# Patient Record
Sex: Male | Born: 1985 | Race: White | Hispanic: No | Marital: Single | Smoking: Current every day smoker
Health system: Southern US, Community
[De-identification: ages and names within clinical notes are randomized; demographics above are authoritative.]

## PROBLEM LIST (undated history)

## (undated) DIAGNOSIS — M79605 Pain in left leg: Secondary | ICD-10-CM

## (undated) HISTORY — PX: FRACTURE SURGERY: SHX138

## (undated) HISTORY — PX: APPENDECTOMY: SHX54

## (undated) HISTORY — DX: Pain in left leg: M79.605

---

## 2009-12-17 HISTORY — PX: OTHER SURGICAL HISTORY: SHX169

## 2009-12-24 ENCOUNTER — Inpatient Hospital Stay: Payer: Self-pay | Admitting: General Practice

## 2011-04-21 ENCOUNTER — Emergency Department: Payer: Self-pay | Admitting: Emergency Medicine

## 2015-06-23 ENCOUNTER — Encounter: Payer: Self-pay | Admitting: Emergency Medicine

## 2015-06-23 ENCOUNTER — Emergency Department: Payer: Self-pay

## 2015-06-23 ENCOUNTER — Emergency Department
Admission: EM | Admit: 2015-06-23 | Discharge: 2015-06-23 | Disposition: A | Discharge status: Home / Self Care | Payer: Self-pay | Attending: Emergency Medicine | Admitting: Emergency Medicine

## 2015-06-23 DIAGNOSIS — Y939 Activity, unspecified: Secondary | ICD-10-CM | POA: Insufficient documentation

## 2015-06-23 DIAGNOSIS — F17200 Nicotine dependence, unspecified, uncomplicated: Secondary | ICD-10-CM | POA: Insufficient documentation

## 2015-06-23 DIAGNOSIS — Y929 Unspecified place or not applicable: Secondary | ICD-10-CM | POA: Insufficient documentation

## 2015-06-23 DIAGNOSIS — S93401A Sprain of unspecified ligament of right ankle, initial encounter: Secondary | ICD-10-CM | POA: Insufficient documentation

## 2015-06-23 DIAGNOSIS — Y999 Unspecified external cause status: Secondary | ICD-10-CM | POA: Insufficient documentation

## 2015-06-23 DIAGNOSIS — W19XXXA Unspecified fall, initial encounter: Secondary | ICD-10-CM | POA: Insufficient documentation

## 2015-06-23 MED ORDER — IBUPROFEN 800 MG PO TABS: 800.0000 mg | ORAL_TABLET | Freq: Three times a day (TID) | ORAL | Status: DC | PRN

## 2015-06-23 MED ORDER — TRAMADOL HCL 50 MG PO TABS: 50.0000 mg | ORAL_TABLET | Freq: Four times a day (QID) | ORAL | Status: DC | PRN

## 2015-06-23 MED ORDER — IBUPROFEN 800 MG PO TABS
800.0000 mg | ORAL_TABLET | Freq: Once | ORAL | Status: AC
  Administered 2015-06-23: 800 mg via ORAL
  Filled 2015-06-23: qty 1

## 2015-06-23 NOTE — Discharge Instructions (Signed)
Ankle Sprain °An ankle sprain is an injury to the strong, fibrous tissues (ligaments) that hold the bones of your ankle joint together.  °CAUSES °An ankle sprain is usually caused by a fall or by twisting your ankle. Ankle sprains most commonly occur when you step on the outer edge of your foot, and your ankle turns inward. People who participate in sports are more prone to these types of injuries.  °SYMPTOMS  °· Pain in your ankle. The pain may be present at rest or only when you are trying to stand or walk. °· Swelling. °· Bruising. Bruising may develop immediately or within 1 to 2 days after your injury. °· Difficulty standing or walking, particularly when turning corners or changing directions. °DIAGNOSIS  °Your caregiver will ask you details about your injury and perform a physical exam of your ankle to determine if you have an ankle sprain. During the physical exam, your caregiver will press on and apply pressure to specific areas of your foot and ankle. Your caregiver will try to move your ankle in certain ways. An X-ray exam may be done to be sure a bone was not broken or a ligament did not separate from one of the bones in your ankle (avulsion fracture).  °TREATMENT  °Certain types of braces can help stabilize your ankle. Your caregiver can make a recommendation for this. Your caregiver may recommend the use of medicine for pain. If your sprain is severe, your caregiver may refer you to a surgeon who helps to restore function to parts of your skeletal system (orthopedist) or a physical therapist. °HOME CARE INSTRUCTIONS  °· Apply ice to your injury for 1-2 days or as directed by your caregiver. Applying ice helps to reduce inflammation and pain. °· Put ice in a plastic bag. °· Place a towel between your skin and the bag. °· Leave the ice on for 15-20 minutes at a time, every 2 hours while you are awake. °· Only take over-the-counter or prescription medicines for pain, discomfort, or fever as directed by  your caregiver. °· Elevate your injured ankle above the level of your heart as much as possible for 2-3 days. °· If your caregiver recommends crutches, use them as instructed. Gradually put weight on the affected ankle. Continue to use crutches or a cane until you can walk without feeling pain in your ankle. °· If you have a plaster splint, wear the splint as directed by your caregiver. Do not rest it on anything harder than a pillow for the first 24 hours. Do not put weight on it. Do not get it wet. You may take it off to take a shower or bath. °· You may have been given an elastic bandage to wear around your ankle to provide support. If the elastic bandage is too tight (you have numbness or tingling in your foot or your foot becomes cold and blue), adjust the bandage to make it comfortable. °· If you have an air splint, you may blow more air into it or let air out to make it more comfortable. You may take your splint off at night and before taking a shower or bath. Wiggle your toes in the splint several times per day to decrease swelling. °SEEK MEDICAL CARE IF:  °· You have rapidly increasing bruising or swelling. °· Your toes feel extremely cold or you lose feeling in your foot. °· Your pain is not relieved with medicine. °SEEK IMMEDIATE MEDICAL CARE IF: °· Your toes are numb or blue. °·   You have severe pain that is increasing. °MAKE SURE YOU:  °· Understand these instructions. °· Will watch your condition. °· Will get help right away if you are not doing well or get worse. °  °This information is not intended to replace advice given to you by your health care provider. Make sure you discuss any questions you have with your health care provider. °  °Document Released: 01/14/2005 Document Revised: 02/04/2014 Document Reviewed: 01/26/2011 °Elsevier Interactive Patient Education ©2016 [employer] ° °Cryotherapy °Cryotherapy means treatment with cold. Ice or gel packs can be used to reduce both pain and swelling.  Ice is the most helpful within the first 24 to 48 hours after an injury or flare-up from overusing a muscle or joint. Sprains, strains, spasms, burning pain, shooting pain, and aches can all be eased with ice. Ice can also be used when recovering from surgery. Ice is effective, has very few side effects, and is safe for most people to use. °PRECAUTIONS  °Ice is not a safe treatment option for people with: °· Raynaud phenomenon. This is a condition affecting small blood vessels in the extremities. Exposure to cold may cause your problems to return. °· Cold hypersensitivity. There are many forms of cold hypersensitivity, including: °¨ Cold urticaria. Red, itchy hives appear on the skin when the tissues begin to warm after being iced. °¨ Cold erythema. This is a red, itchy rash caused by exposure to cold. °¨ Cold hemoglobinuria. Red blood cells break down when the tissues begin to warm after being iced. The hemoglobin that carry oxygen are passed into the urine because they cannot combine with blood proteins fast enough. °· Numbness or altered sensitivity in the area being iced. °If you have any of the following conditions, do not use ice until you have discussed cryotherapy with your caregiver: °· Heart conditions, such as arrhythmia, angina, or chronic heart disease. °· High blood pressure. °· Healing wounds or open skin in the area being iced. °· Current infections. °· Rheumatoid arthritis. °· Poor circulation. °· Diabetes. °Ice slows the blood flow in the region it is applied. This is beneficial when trying to stop inflamed tissues from spreading irritating chemicals to surrounding tissues. However, if you expose your skin to cold temperatures for too long or without the proper protection, you can damage your skin or nerves. Watch for signs of skin damage due to cold. °HOME CARE INSTRUCTIONS °Follow these tips to use ice and cold packs safely. °· Place a dry or damp towel between the ice and skin. A damp towel will  cool the skin more quickly, so you may need to shorten the time that the ice is used. °· For a more rapid response, add gentle compression to the ice. °· Ice for no more than 10 to 20 minutes at a time. The bonier the area you are icing, the less time it will take to get the benefits of ice. °· Check your skin after 5 minutes to make sure there are no signs of a poor response to cold or skin damage. °· Rest 20 minutes or more between uses. °· Once your skin is numb, you can end your treatment. You can test numbness by very lightly touching your skin. The touch should be so light that you do not see the skin dimple from the pressure of your fingertip. When using ice, most people will feel these normal sensations in this order: cold, burning, aching, and numbness. °· Do not use ice on someone who   cannot communicate their responses to pain, such as small children or people with dementia. °HOW TO MAKE AN ICE PACK °Ice packs are the most common way to use ice therapy. Other methods include ice massage, ice baths, and cryosprays. Muscle creams that cause a cold, tingly feeling do not offer the same benefits that ice offers and should not be used as a substitute unless recommended by your caregiver. °To make an ice pack, do one of the following: °· Place crushed ice or a bag of frozen vegetables in a sealable plastic bag. Squeeze out the excess air. Place this bag inside another plastic bag. Slide the bag into a pillowcase or place a damp towel between your skin and the bag. °· Mix 3 parts water with 1 part rubbing alcohol. Freeze the mixture in a sealable plastic bag. When you remove the mixture from the freezer, it will be slushy. Squeeze out the excess air. Place this bag inside another plastic bag. Slide the bag into a pillowcase or place a damp towel between your skin and the bag. °SEEK MEDICAL CARE IF: °· You develop white spots on your skin. This may give the skin a blotchy (mottled) appearance. °· Your skin turns  blue or pale. °· Your skin becomes waxy or hard. °· Your swelling gets worse. °MAKE SURE YOU:  °· Understand these instructions. °· Will watch your condition. °· Will get help right away if you are not doing well or get worse. °  °This information is not intended to replace advice given to you by your health care provider. Make sure you discuss any questions you have with your health care provider. °  °Document Released: 09/10/2010 Document Revised: 02/04/2014 Document Reviewed: 09/10/2010 °Elsevier Interactive Patient Education ©2016 [employer] ° °

## 2015-06-23 NOTE — ED Provider Notes (Signed)
CSN: [id number]     Arrival date & time 06/23/15  1818 History   First MD Initiated Contact with Patient 06/23/15 1901     Chief Complaint  Patient presents with  . Ankle Pain     (Consider location/radiation/quality/duration/timing/severity/associated sxs/prior Treatment) HPI  30 year old male presents to the emergency department for evaluation of right ankle pain. Last night, patient fell getting out of his thoughts of, injured his right ankle and upon awakening this morning was still unable to bear weight. He denies any head trauma, loss of consciousness. He only complains of right lateral ankle pain. No foot pain knee or hip pain. He has not had any medications for pain. His pain is severe with weightbearing. Denies any numbness or tingling.  History reviewed. No pertinent past medical history. Past Surgical History  Procedure Laterality Date  . Fracture surgery     No family history on file. Social History  Substance Use Topics  . Smoking status: Current Every Day Smoker  . Smokeless tobacco: None  . Alcohol Use: No    Review of Systems  Constitutional: Negative.   Cardiovascular: Negative for chest pain and leg swelling.  Gastrointestinal: Negative for abdominal pain.  Musculoskeletal: Positive for joint swelling and gait problem. Negative for back pain and neck pain.  Skin: Negative for color change, rash and wound.  Neurological: Negative for dizziness, syncope and weakness.  Psychiatric/Behavioral: Negative for hallucinations and confusion.  All other systems reviewed and are negative.     Allergies  Review of patient's allergies indicates no known allergies.  Home Medications   Prior to Admission medications   Medication Sig Start Date End Date Taking? Authorizing Provider  ibuprofen (ADVIL,MOTRIN) 800 MG tablet Take 1 tablet (800 mg total) by mouth every 8 (eight) hours as needed. 06/23/15   Evon Slackhomas C Moksha Dorgan, PA-C  traMADol (ULTRAM) 50 MG tablet Take 1 tablet  (50 mg total) by mouth every 6 (six) hours as needed. 06/23/15   Evon Slackhomas C Sheikh Leverich, PA-C   BP 119/89 mmHg  Pulse 90  Temp(Src) 98 F (36.7 C) (Oral)  Resp 20  Ht 6\' 1"  (1.854 m)  Wt 72.576 kg  BMI 21.11 kg/m2  SpO2 98% Physical Exam  Constitutional: He is oriented to person, place, and time. He appears well-developed and well-nourished.  HENT:  Head: Normocephalic and atraumatic.  Eyes: Conjunctivae and EOM are normal. Pupils are equal, round, and reactive to light.  Neck: Normal range of motion. Neck supple.  Cardiovascular: Normal rate and intact distal pulses.   Pulmonary/Chest: Effort normal. No respiratory distress.  Musculoskeletal:  Examination of right lower extremity shows patient has full range of motion of the hip knee. Right ankle is swollen along the lateral malleolus with tenderness. There is no medial malleolus tenderness. Patient has full ankle plantarflexion and dorsiflexion, nontender along the Achilles. No skin breakdown noted.  Neurological: He is alert and oriented to person, place, and time.  Skin: Skin is warm and dry.  Psychiatric: He has a normal mood and affect. His behavior is normal. Judgment and thought content normal.    ED Course  Procedures (including critical care time)SPLINT APPLICATION Date/Time: 7:56 PM Authorized by: Patience MuscaGAINES, Arlind Klingerman CHRISTOPHER Consent: Verbal consent obtained. Risks and benefits: risks, benefits and alternatives were discussed Consent given by: patient Splint applied by: ED tech right ankle  Location details: right ankle  Splint type: ankle prefabricated  Supplies used: ankle stirrup prefabricated splint Post-procedure: The splinted body part was neurovascularly unchanged following the procedure. Patient  tolerance: Patient tolerated the procedure well with no immediate complications.    Labs Review Labs Reviewed - No data to display  Imaging Review Dg Ankle Complete Right  06/23/2015  CLINICAL DATA:  Lateral ankle pain  status post fall EXAM: RIGHT ANKLE - COMPLETE 3+ VIEW COMPARISON:  None. FINDINGS: There is no evidence of fracture, dislocation, or joint effusion. There is no evidence of arthropathy or other focal bone abnormality. There is soft tissue swelling over the lateral malleolus. IMPRESSION: No acute osseous injury of the right ankle. Electronically Signed   By: Elige Ko   On: 06/23/2015 19:33   I have personally reviewed and evaluated these images and lab results as part of my medical decision-making.   EKG Interpretation None      MDM   Final diagnoses:  Right ankle sprain, initial encounter    30 year old male with right ankle sprain. Ibuprofen and tramadol prn pain. Rest, Ice, Elevation. Crutches. Follow up with Orthopedics if no improvement in 5-7 days    Evon Slack, PA-C 06/23/15 2001  Jeanmarie Plant, MD 06/23/15 2031

## 2015-06-23 NOTE — ED Notes (Signed)
States he went to stand up last pm and fell  Pain and swelling noted to right ankle area

## 2015-08-05 ENCOUNTER — Encounter: Payer: Self-pay | Admitting: Emergency Medicine

## 2015-08-05 ENCOUNTER — Emergency Department
Admission: EM | Admit: 2015-08-05 | Discharge: 2015-08-05 | Disposition: A | Discharge status: Home / Self Care | Payer: Self-pay | Attending: Emergency Medicine | Admitting: Emergency Medicine

## 2015-08-05 DIAGNOSIS — F17200 Nicotine dependence, unspecified, uncomplicated: Secondary | ICD-10-CM | POA: Insufficient documentation

## 2015-08-05 DIAGNOSIS — Y939 Activity, unspecified: Secondary | ICD-10-CM | POA: Insufficient documentation

## 2015-08-05 DIAGNOSIS — Y929 Unspecified place or not applicable: Secondary | ICD-10-CM | POA: Insufficient documentation

## 2015-08-05 DIAGNOSIS — X58XXXA Exposure to other specified factors, initial encounter: Secondary | ICD-10-CM | POA: Insufficient documentation

## 2015-08-05 DIAGNOSIS — Y999 Unspecified external cause status: Secondary | ICD-10-CM | POA: Insufficient documentation

## 2015-08-05 DIAGNOSIS — M62830 Muscle spasm of back: Secondary | ICD-10-CM | POA: Insufficient documentation

## 2015-08-05 DIAGNOSIS — S29019A Strain of muscle and tendon of unspecified wall of thorax, initial encounter: Secondary | ICD-10-CM | POA: Insufficient documentation

## 2015-08-05 MED ORDER — NAPROXEN 500 MG PO TABS: 500.0000 mg | ORAL_TABLET | Freq: Two times a day (BID) | ORAL | Status: DC

## 2015-08-05 MED ORDER — KETOROLAC TROMETHAMINE 30 MG/ML IJ SOLN
30.0000 mg | Freq: Once | INTRAMUSCULAR | Status: AC
  Administered 2015-08-05: 30 mg via INTRAMUSCULAR
  Filled 2015-08-05: qty 1

## 2015-08-05 MED ORDER — CYCLOBENZAPRINE HCL 10 MG PO TABS: 10.0000 mg | ORAL_TABLET | Freq: Three times a day (TID) | ORAL | Status: DC | PRN

## 2015-08-05 NOTE — Discharge Instructions (Signed)
Back Exercises The following exercises strengthen the muscles that help to support the back. They also help to keep the lower back flexible. Doing these exercises can help to prevent back pain or lessen existing pain. If you have back pain or discomfort, try doing these exercises 2-3 times each day or as told by your health care provider. When the pain goes away, do them once each day, but increase the number of times that you repeat the steps for each exercise (do more repetitions). If you do not have back pain or discomfort, do these exercises once each day or as told by your health care provider. EXERCISES Single Knee to Chest Repeat these steps 3-5 times for each leg:  Lie on your back on a firm bed or the floor with your legs extended.  Bring one knee to your chest. Your other leg should stay extended and in contact with the floor.  Hold your knee in place by grabbing your knee or thigh.  Pull on your knee until you feel a gentle stretch in your lower back.  Hold the stretch for 10-30 seconds.  Slowly release and straighten your leg. Pelvic Tilt Repeat these steps 5-10 times:  Lie on your back on a firm bed or the floor with your legs extended.  Bend your knees so they are pointing toward the ceiling and your feet are flat on the floor.  Tighten your lower abdominal muscles to press your lower back against the floor. This motion will tilt your pelvis so your tailbone points up toward the ceiling instead of pointing to your feet or the floor.  With gentle tension and even breathing, hold this position for 5-10 seconds. Cat-Cow Repeat these steps until your lower back becomes more flexible:  Get into a hands-and-knees position on a firm surface. Keep your hands under your shoulders, and keep your knees under your hips. You may place padding under your knees for comfort.  Let your head hang down, and point your tailbone toward the floor so your lower back becomes rounded like the  back of a cat.  Hold this position for 5 seconds.  Slowly lift your head and point your tailbone up toward the ceiling so your back forms a sagging arch like the back of a cow.  Hold this position for 5 seconds. Press-Ups Repeat these steps 5-10 times:  Lie on your abdomen (face-down) on the floor.  Place your palms near your head, about shoulder-width apart.  While you keep your back as relaxed as possible and keep your hips on the floor, slowly straighten your arms to raise the top half of your body and lift your shoulders. Do not use your back muscles to raise your upper torso. You may adjust the placement of your hands to make yourself more comfortable.  Hold this position for 5 seconds while you keep your back relaxed.  Slowly return to lying flat on the floor. Bridges Repeat these steps 10 times:  Lie on your back on a firm surface.  Bend your knees so they are pointing toward the ceiling and your feet are flat on the floor.  Tighten your buttocks muscles and lift your buttocks off of the floor until your waist is at almost the same height as your knees. You should feel the muscles working in your buttocks and the back of your thighs. If you do not feel these muscles, slide your feet 1-2 inches farther away from your buttocks.  Hold this position for 3-5  seconds.  Slowly lower your hips to the starting position, and allow your buttocks muscles to relax completely. If this exercise is too easy, try doing it with your arms crossed over your chest. Abdominal Crunches Repeat these steps 5-10 times:  Lie on your back on a firm bed or the floor with your legs extended.  Bend your knees so they are pointing toward the ceiling and your feet are flat on the floor.  Cross your arms over your chest.  Tip your chin slightly toward your chest without bending your neck.  Tighten your abdominal muscles and slowly raise your trunk (torso) high enough to lift your shoulder blades a  tiny bit off of the floor. Avoid raising your torso higher than that, because it can put too much stress on your low back and it does not help to strengthen your abdominal muscles.  Slowly return to your starting position. Back Lifts Repeat these steps 5-10 times: 1. Lie on your abdomen (face-down) with your arms at your sides, and rest your forehead on the floor. 2. Tighten the muscles in your legs and your buttocks. 3. Slowly lift your chest off of the floor while you keep your hips pressed to the floor. Keep the back of your head in line with the curve in your back. Your eyes should be looking at the floor. 4. Hold this position for 3-5 seconds. 5. Slowly return to your starting position. SEEK MEDICAL CARE IF:  Your back pain or discomfort gets much worse when you do an exercise.  Your back pain or discomfort does not lessen within 2 hours after you exercise. If you have any of these problems, stop doing these exercises right away. Do not do them again unless your health care provider says that you can. SEEK IMMEDIATE MEDICAL CARE IF:  You develop sudden, severe back pain. If this happens, stop doing the exercises right away. Do not do them again unless your health care provider says that you can.   This information is not intended to replace advice given to you by your health care provider. Make sure you discuss any questions you have with your health care provider.   Document Released: 02/22/2004 Document Revised: 10/05/2014 Document Reviewed: 03/10/2014 Elsevier Interactive Patient Education 2016 [place] Injury Prevention Back injuries can be very painful. They can also be difficult to heal. After having one back injury, you are more likely to injure your back again. It is important to learn how to avoid injuring or re-injuring your back. The following tips can help you to prevent a back injury. WHAT SHOULD I KNOW ABOUT PHYSICAL FITNESS?  Exercise for 30 minutes per  day on most days of the week or as told by your doctor. Make sure to:  Do aerobic exercises, such as walking, jogging, biking, or swimming.  Do exercises that increase balance and strength, such as tai chi and yoga.  Do stretching exercises. This helps with flexibility.  Try to develop strong belly (abdominal) muscles. Your belly muscles help to support your back.  Stay at a healthy weight. This helps to decrease your risk of a back injury. WHAT SHOULD I KNOW ABOUT MY DIET?  Talk with your doctor about your overall diet. Take supplements and vitamins only as told by your doctor.  Talk with your doctor about how much calcium and vitamin D you need each day. These nutrients help to prevent weakening of the bones (osteoporosis).  Include good sources of calcium in your  diet, such as:  Dairy products.  Green leafy vegetables.  Products that have had calcium added to them (fortified).  Include good sources of vitamin D in your diet, such as:  Milk.  Foods that have had vitamin D added to them. WHAT SHOULD I KNOW ABOUT MY POSTURE?  Sit up straight and stand up straight. Avoid leaning forward when you sit or hunching over when you stand.  Choose chairs that have good low-back (lumbar) support.  If you work at a desk, sit close to it so you do not need to lean over. Keep your chin tucked in. Keep your neck drawn back. Keep your elbows bent so your arms look like the letter "L" (right angle).  Sit high and close to the steering wheel when you drive. Add a low-back support to your car seat, if needed.  Avoid sitting or standing in one position for very long. Take breaks to get up, stretch, and walk around at least one time every hour. Take breaks every hour if you are driving for long periods of time.  Sleep on your side with your knees slightly bent, or sleep on your back with a pillow under your knees. Do not lie on the front of your body to sleep. WHAT SHOULD I KNOW ABOUT LIFTING,  TWISTING, AND REACHING Lifting and Heavy Lifting  Avoid heavy lifting, especially lifting over and over again. If you must do heavy lifting:  Stretch before lifting.  Work slowly.  Rest between lifts.  Use a tool such as a cart or a dolly to move objects if one is available.  Make several small trips instead of carrying one heavy load.  Ask for help when you need it, especially when moving big objects.  Follow these steps when lifting:  Stand with your feet shoulder-width apart.  Get as close to the object as you can. Do not pick up a heavy object that is far from your body.  Use handles or lifting straps if they are available.  Bend at your knees. Squat down, but keep your heels off the floor.  Keep your shoulders back. Keep your chin tucked in. Keep your back straight.  Lift the object slowly while you tighten the muscles in your legs, belly, and butt. Keep the object as close to the center of your body as possible.  Follow these steps when putting down a heavy load:  Stand with your feet shoulder-width apart.  Lower the object slowly while you tighten the muscles in your legs, belly, and butt. Keep the object as close to the center of your body as possible.  Keep your shoulders back. Keep your chin tucked in. Keep your back straight.  Bend at your knees. Squat down, but keep your heels off the floor.  Use handles or lifting straps if they are available. Twisting and Reaching  Avoid lifting heavy objects above your waist.  Do not twist at your waist while you are lifting or carrying a load. If you need to turn, move your feet.  Do not bend over without bending at your knees.  Avoid reaching over your head, across a table, or for an object on a high surface.  WHAT ARE SOME OTHER TIPS?  Avoid wet floors and icy ground. Keep sidewalks clear of ice to prevent falls.   Do not sleep on a mattress that is too soft or too hard.   Keep items that you use often  within easy reach.   Put heavier objects  on shelves at waist level, and put lighter objects on lower or higher shelves.  Find ways to lower your stress, such as:  Exercise.  Massage.  Relaxation techniques.  Talk with your doctor if you feel anxious or depressed. These conditions can make back pain worse.  Wear flat heel shoes with cushioned soles.  Avoid making quick (sudden) movements.  Use both shoulder straps when carrying a backpack.  Do not use any tobacco products, including cigarettes, chewing tobacco, or electronic cigarettes. If you need help quitting, ask your doctor.   This information is not intended to replace advice given to you by your health care provider. Make sure you discuss any questions you have with your health care provider.   Document Released: 07/03/2007 Document Revised: 05/31/2014 Document Reviewed: 01/18/2014 Elsevier Interactive Patient Education 2016 [employer] Inc[employer]  [employer] Therapy Heat therapy can help ease sore, stiff, injured, and tight muscles and joints. Heat relaxes your muscles, which may help ease your pain. Heat therapy should only be used on old, pre-existing, or long-lasting (chronic) injuries. Do not use heat therapy unless told by your doctor. HOW TO USE HEAT THERAPY There are several different kinds of heat therapy, including:  Moist heat pack.  Warm water bath.  Hot water bottle.  Electric heating pad.  Heated gel pack.  Heated wrap.  Electric heating pad. GENERAL HEAT THERAPY RECOMMENDATIONS   Do not sleep while using heat therapy. Only use heat therapy while you are awake.  Your skin may turn pink while using heat therapy. Do not use heat therapy if your skin turns red.  Do not use heat therapy if you have new pain.  High heat or long exposure to heat can cause burns. Be careful when using heat therapy to avoid burning your skin.  Do not use heat therapy on areas of your skin that are already irritated, such as with  a rash or sunburn. GET HELP IF:   You have blisters, redness, swelling (puffiness), or numbness.  You have new pain.  Your pain is worse. MAKE SURE YOU:  Understand these instructions.  Will watch your condition.  Will get help right away if you are not doing well or get worse.   This information is not intended to replace advice given to you by your health care provider. Make sure you discuss any questions you have with your health care provider.   Document Released: 04/08/2011 Document Revised: 02/04/2014 Document Reviewed: 03/09/2013 Elsevier Interactive Patient Education 2016 Elsevier Inc.  Muscle Cramps and Spasms Muscle cramps and spasms are when muscles tighten by themselves. They usually get better within minutes. Muscle cramps are painful. They are usually stronger and last longer than muscle spasms. Muscle spasms may or may not be painful. They can last a few seconds or much longer. HOME CARE  Drink enough fluid to keep your pee (urine) clear or pale yellow.  Massage, stretch, and relax the muscle.  Use a warm towel, heating pad, or warm shower water on tight muscles.  Place ice on the muscle if it is tender or in pain.  Put ice in a plastic bag.  Place a towel between your skin and the bag.  Leave the ice on for 15-20 minutes, 03-04 times a day.  Only take medicine as told by your doctor. GET HELP RIGHT AWAY IF:  Your cramps or spasms get worse, happen more often, or do not get better with time. MAKE SURE YOU:  Understand these instructions.  Will watch your  condition.  Will get help right away if you are not doing well or get worse.   This information is not intended to replace advice given to you by your health care provider. Make sure you discuss any questions you have with your health care provider.   Document Released: 12/28/2007 Document Revised: 05/11/2012 Document Reviewed: 01/01/2012 Elsevier Interactive Patient Education 2016 [place].  Thoracic Strain Thoracic strain is an injury to the muscles or tendons that attach to the upper back. A strain can be mild or severe. A mild strain may take only 1-2 weeks to heal. A severe strain involves torn muscles or tendons, so it may take 6-8 weeks to heal. [place] as needed. Limit your activity as told by your doctor.  If directed, put ice on the injured area:  Put ice in a plastic bag.  Place a towel between your skin and the bag.  Leave the ice on for 20 minutes, 2-3 times per day.  Take over-the-counter and prescription medicines only as told by your doctor.  Begin doing exercises as told by your doctor or physical therapist.  Warm up before being active.  Bend your knees before you lift heavy objects.  Keep all follow-up visits as told by your doctor. This is important. GET HELP IF:  Your pain is not helped by medicine.  Your pain, bruising, or swelling is getting worse.  You have a fever. GET HELP RIGHT AWAY IF:  You have shortness of breath.  You have chest pain.  You have weakness or loss of feeling (numbness) in your legs.  You cannot control when you pee (urinate).   This information is not intended to replace advice given to you by your health care provider. Make sure you discuss any questions you have with your health care provider.   Document Released: 07/03/2007 Document Revised: 10/05/2014 Document Reviewed: 03/10/2014 Elsevier Interactive Patient Education [employer].

## 2015-08-05 NOTE — ED Provider Notes (Signed)
[facility] Emergency Department Provider Note  ____________________________________________  Time seen: Approximately 5:02 PM  I have reviewed the triage vital signs and the nursing notes.   HISTORY  Chief Complaint Back Pain    HPI Luis Terrell is a 30 y.o. male , NAD, presents to emergency department with 1 week history of mid back pain. States he was "jacking up" when he felt a pain in the middle of his back. States that he was unable to get out of bed for 1 week due to the pain. Has been taking over-the-counter ibuprofen without relief of pain. Notes that he's been able to ambulate over the last day or so but states his back pain is no better. Denies any chest pain, shortness breath, numbness, weakness, tingling. Denies any specific injuries or traumas to the back. Denies any neck pain, saddle paresthesias, loss of bowel or bladder control. Has not had any fevers, chills, body aches. No changes in urinary or bowel habits.   History reviewed. No pertinent past medical history.  There are no active problems to display for this patient.   Past Surgical History  Procedure Laterality Date  . Fracture surgery      Current Outpatient Rx  Name  Route  Sig  Dispense  Refill  . cyclobenzaprine (FLEXERIL) 10 MG tablet   Oral   Take 1 tablet (10 mg total) by mouth 3 (three) times daily as needed for muscle spasms.   21 tablet   0   . naproxen (NAPROSYN) 500 MG tablet   Oral   Take 1 tablet (500 mg total) by mouth 2 (two) times daily with a meal.   14 tablet   0     Allergies Review of patient's allergies indicates no known allergies.  History reviewed. No pertinent family history.  Social History Social History  Substance Use Topics  . Smoking status: Current Every Day Smoker  . Smokeless tobacco: None  . Alcohol Use: No     Review of Systems  Constitutional: No fever/chills, fatigue Cardiovascular: No chest pain. Respiratory:  No  shortness of breath. No wheezing.  Gastrointestinal: No abdominal pain.  No nausea, vomiting.   Musculoskeletal: Positive for back pain. No neck, extremity pain. Skin: Negative for rash, redness, swelling, bruising, open wounds or lacerations. Neurological: Negative for headaches, focal weakness or numbness. No tingling, saddle paresthesias, loss of bowel or bladder control 10-point ROS otherwise negative.  ____________________________________________   PHYSICAL EXAM:  VITAL SIGNS: ED Triage Vitals  Enc Vitals Group     BP --      Pulse --      Resp --      Temp --      Temp src --      SpO2 --      Weight --      Height --      Head Cir --      Peak Flow --      Pain Score 08/05/15 1604 10     Pain Loc --      Pain Edu? --      Excl. in GC? --      Constitutional: Alert and oriented. Well appearing and in no acute distress. Eyes: Conjunctivae are normal.   Head: Atraumatic. Neck: Supple with full range of motion. No cervical spine tenderness to palpation. Hematological/Lymphatic/Immunilogical: No cervical lymphadenopathy. Cardiovascular: Good peripheral circulation. Respiratory: Normal respiratory effort without tachypnea or retractions.  Musculoskeletal: Tenderness to palpation about the midline  thoracic spine and paraspinal regions. Muscle spasm appreciated palpation about the left lower thoracic paraspinal region. No tenderness to palpation about the lumbar or sacral regions. No SI joint tenderness. Negative straight leg raise test bilaterally. Neurologic:  Normal speech and language. No gross focal neurologic deficits are appreciated. Normal gait and posture. Skin:  Skin is warm, dry and intact. No rash, redness, swelling, skin sores, bruising noted. Psychiatric: Mood and affect are normal. Speech and behavior are normal. Patient exhibits appropriate insight and judgement.   ____________________________________________    LABS  None ____________________________________________  EKG  None ____________________________________________  RADIOLOGY  None ____________________________________________    PROCEDURES  Procedure(s) performed: None    Medications  ketorolac (TORADOL) 30 MG/ML injection 30 mg (30 mg Intramuscular Given 08/05/15 1709)     ____________________________________________   INITIAL IMPRESSION / ASSESSMENT AND PLAN / ED COURSE  Patient's diagnosis is consistent with Thoracic strain and muscle spasm of back. Patient will be discharged home with prescriptions for Flexeril and Naprosyn to take as directed. Patient advised to apply warm heat the affected area 20 minutes 3-4 times daily as needed. Discussed that the patient should be active throughout the day as inactivity increases back pain. Discussed range of motion stretching exercises that he may complete daily. [facility] if symptoms persist past this treatment course. Patient is given ED precautions to return to the ED for any worsening or new symptoms.    ____________________________________________  FINAL CLINICAL IMPRESSION(S) / ED DIAGNOSES  Final diagnoses:  Thoracic myofascial strain, initial encounter  Muscle spasm of back      NEW MEDICATIONS STARTED DURING THIS VISIT:  New Prescriptions   CYCLOBENZAPRINE (FLEXERIL) 10 MG TABLET    Take 1 tablet (10 mg total) by mouth 3 (three) times daily as needed for muscle spasms.   NAPROXEN (NAPROSYN) 500 MG TABLET    Take 1 tablet (500 mg total) by mouth 2 (two) times daily with a meal.         Hope PigeonJami L Karin Pinedo, PA-C 08/05/15 1722  Myrna Blazeravid Matthew Schaevitz, MD 08/05/15 [id number]

## 2015-08-05 NOTE — ED Notes (Signed)
Pt to ed with c/o middle back pain x 1 week.  States "I think I might have pulled a muscle"

## 2015-10-15 ENCOUNTER — Emergency Department: Payer: Self-pay

## 2015-10-15 ENCOUNTER — Encounter: Payer: Self-pay | Admitting: Emergency Medicine

## 2015-10-15 ENCOUNTER — Emergency Department
Admission: EM | Admit: 2015-10-15 | Discharge: 2015-10-15 | Disposition: A | Discharge status: Home / Self Care | Payer: Self-pay | Attending: Emergency Medicine | Admitting: Emergency Medicine

## 2015-10-15 ENCOUNTER — Other Ambulatory Visit: Payer: Self-pay

## 2015-10-15 DIAGNOSIS — R0789 Other chest pain: Secondary | ICD-10-CM

## 2015-10-15 DIAGNOSIS — I517 Cardiomegaly: Secondary | ICD-10-CM | POA: Insufficient documentation

## 2015-10-15 DIAGNOSIS — F17210 Nicotine dependence, cigarettes, uncomplicated: Secondary | ICD-10-CM | POA: Insufficient documentation

## 2015-10-15 LAB — CBC
HEMATOCRIT: 44.9 % (ref 40.0–52.0)
HEMOGLOBIN: 15.6 g/dL (ref 13.0–18.0)
MCH: 32.4 pg (ref 26.0–34.0)
MCHC: 34.8 g/dL (ref 32.0–36.0)
MCV: 93 fL (ref 80.0–100.0)
Platelets: 325 10*3/uL (ref 150–440)
RBC: 4.83 MIL/uL (ref 4.40–5.90)
RDW: 13.6 % (ref 11.5–14.5)
WBC: 7.1 10*3/uL (ref 3.8–10.6)

## 2015-10-15 LAB — BASIC METABOLIC PANEL
ANION GAP: 9 (ref 5–15)
BUN: 16 mg/dL (ref 6–20)
CALCIUM: 9.6 mg/dL (ref 8.9–10.3)
CO2: 27 mmol/L (ref 22–32)
Chloride: 103 mmol/L (ref 101–111)
Creatinine, Ser: 0.78 mg/dL (ref 0.61–1.24)
GFR calc Af Amer: >60 mL/min (ref >60)
Glucose, Bld: 119 mg/dL — ABNORMAL HIGH (ref 65–99)
POTASSIUM: 3.1 mmol/L — AB (ref 3.5–5.1)
SODIUM: 139 mmol/L (ref 135–145)

## 2015-10-15 LAB — TROPONIN I

## 2015-10-15 MED ORDER — HYDROCODONE-ACETAMINOPHEN 5-325 MG PO TABS
ORAL_TABLET | ORAL | Status: AC
  Filled 2015-10-15: qty 1

## 2015-10-15 MED ORDER — HYDROCODONE-ACETAMINOPHEN 5-325 MG PO TABS
1.0000 | ORAL_TABLET | Freq: Once | ORAL | Status: AC
  Administered 2015-10-15: 1 via ORAL

## 2015-10-15 MED ORDER — IOPAMIDOL (ISOVUE-370) INJECTION 76%
75.0000 mL | Freq: Once | INTRAVENOUS | Status: AC | PRN
  Administered 2015-10-15: 75 mL via INTRAVENOUS
  Filled 2015-10-15: qty 75

## 2015-10-15 MED ORDER — TRAMADOL HCL 50 MG PO TABS: 50.0000 mg | ORAL_TABLET | Freq: Four times a day (QID) | ORAL | 0 refills | Status: DC | PRN

## 2015-10-15 NOTE — Discharge Instructions (Signed)
Return to the emergency room for any new or worrisome symptoms. Take the pain medication she isprescribed. Take regular and constant deep breaths to make sure he did not develop a pneumonia from this injury. If you have increased pain, shortness of breath, numbness or weakness, thoughts of hurting yourself or anyone else or you feel worse in any way return to the emergency department. It is absolutely vital that you follow up with cardiology as prescribed as an outpatient.

## 2015-10-15 NOTE — ED Triage Notes (Signed)
Pt states 3 days ago he hit his left side on a trailer hitch and c/o rib pain from left side all the away around to the left back. Pt c/o shortness of breath and difficulty taking a deep. Voice is hoarse since the day after the incident. Pt had coughed up some blood tinge sputum at times. Reports poor appetite. Short of breath when speaking in short sentences.

## 2015-10-15 NOTE — ED Provider Notes (Addendum)
[facility] Emergency Department Provider Note  ____________________________________________   I have reviewed the triage vital signs and the nursing notes.   HISTORY  Chief Complaint Chest Pain; Cough; and Weakness    HPI Luis Terrell is a 30 y.o. male who presents today 3 days after he ran into a trailer. Apparently, the child was trying to climb into the boat and he was trying to run around and stop her from doing so and instead or in his trailer. He impacted a trailer on the patient's left side. He did not pass out or suffer other injury. Did not hit his neck. States that since that time he is in significant pain in that area. He states that there was a red mark to the first day with a red mark is completely went away the next day and now there is no bruising. Patient denies any fever or chills. There are some other chief complaints listed, patient states he has a hoarse voice but is getting over cold and that antecedent the injury, he also states that he has chronic ankle pain has been there for years and sometimes it hurts when he walks. None of these are connected to the fact that he injured his chest. He's been trying heating pads and taken some leftover pain medication with no success in alleviating his discomfort. Hurts to breathe change position and Center.     History reviewed. No pertinent past medical history.  There are no active problems to display for this patient.   Past Surgical History:  Procedure Laterality Date  . APPENDECTOMY    . FRACTURE SURGERY      Prior to Admission medications   Medication Sig Start Date End Date Taking? Authorizing Provider  cyclobenzaprine (FLEXERIL) 10 MG tablet Take 1 tablet (10 mg total) by mouth 3 (three) times daily as needed for muscle spasms. 08/05/15   Jami L Hagler, PA-C  naproxen (NAPROSYN) 500 MG tablet Take 1 tablet (500 mg total) by mouth 2 (two) times daily with a meal. 08/05/15   Jami L Hagler,  PA-C    Allergies Review of patient's allergies indicates no known allergies.  History reviewed. No pertinent family history.  Social History Social History  Substance Use Topics  . Smoking status: Current Every Day Smoker    Packs/day: 0.50    Types: Cigarettes  . Smokeless tobacco: Never Used  . Alcohol use No    Review of Systems Constitutional: No fever/chills Eyes: No visual changes. ENT: No sore throat. No stiff neck no neck pain Cardiovascular: See history of present illness Respiratory: Denies shortness of breath. Gastrointestinal:   no vomiting.  No diarrhea.  No constipation. Genitourinary: Negative for dysuria. Musculoskeletal: Negative lower extremity swelling Skin: Negative for rash. Neurological: Negative for severe headaches, focal weakness or numbness. 10-point ROS otherwise negative.  ____________________________________________   PHYSICAL EXAM:  VITAL SIGNS: ED Triage Vitals  Enc Vitals Group     BP 10/15/15 1748 136/89     Pulse Rate 10/15/15 1748 (!) 112     Resp 10/15/15 1748 (!) 24     Temp 10/15/15 1748 97.6 F (36.4 C)     Temp Source 10/15/15 1748 Oral     SpO2 10/15/15 1748 100 %     Weight 10/15/15 1749 150 lb (68 kg)     Height 10/15/15 1749 6\' 1"  (1.854 m)     Head Circumference --      Peak Flow --  Pain Score 10/15/15 1749 10     Pain Loc --      Pain Edu? --      Excl. in GC? --     Constitutional: Alert and oriented. Well appearing and in no acute distress. Eyes: Conjunctivae are normal. PERRL. EOMI. Head: Atraumatic. Nose: No congestion/rhinnorhea. Mouth/Throat: Mucous membranes are moist.  Oropharynx non-erythematous. Neck: No stridor.   Nontender with no meningismus Cardiovascular: Normal rate, regular rhythm. Grossly normal heart sounds.  Good peripheral circulation. Chest: Tender to palpation of the ribs and left chest wall. There is no bruising there is no crepitus there is no flail chest, and I touch this area  patient states "ouch that's the pain right there" and pulls back. There is no evidence of splenic injury, he has no pain to palpation in his spine.  Respiratory: Normal respiratory effort.  No retractions. Lungs CTAB. Abdominal: Soft and nontender. No distention. No guarding no rebound Back:  There is no focal tenderness or step off.  there is no midline tenderness there are no lesions noted. there is no CVA tenderness Musculoskeletal: No lower extremity tenderness, no upper extremity tenderness. No joint effusions, no DVT signs strong distal pulses no edema Neurologic:  Normal speech and language. No gross focal neurologic deficits are appreciated.  Skin:  Skin is warm, dry and intact. No rash noted. Psychiatric: Mood and affect are normal. Speech and behavior are normal.  ____________________________________________   LABS (all labs ordered are listed, but only abnormal results are displayed)  Labs Reviewed  BASIC METABOLIC PANEL - Abnormal; Notable for the following:       Result Value   Potassium 3.1 (*)    Glucose, Bld 119 (*)    All other components within normal limits  CBC  TROPONIN I   ____________________________________________  EKG  I personally interpreted any EKGs ordered by me or triageSinus rhythm rate 84 bpm no acute ST elevation, there are lateralizing flipped T waves consistent with a possible strain pattern, there is also large voltage consistent with LVH. No acute ST elevation or depression. ____________________________________________  RADIOLOGY  I reviewed any imaging ordered by me or triage that were performed during my shift and, if possible, patient and/or family made aware of any abnormal findings. ____________________________________________   PROCEDURES  Procedure(s) performed: None  Procedures  Critical Care performed: None  ____________________________________________   INITIAL IMPRESSION / ASSESSMENT AND PLAN / ED COURSE  Pertinent labs  & imaging results that were available during my care of the patient were reviewed by me and considered in my medical decision making (see chart for details).  Patient presents with tenderness to his rib cage after a three-day old incision. No stridor noted, no evidence of external injury but he has tenderness to palpation. Chest x-ray is negative for pneumothorax, very reproducible pain. At this time, there does not appear to be clinical evidence to support the diagnosis of pulmonary embolus, dissection, myocarditis, endocarditis, pericarditis, pericardial tamponade, acute coronary syndrome, pneumothorax, pneumonia, or any other acute intrathoracic pathology that will require admission or acute intervention. Nor is there evidence of any significant intra-abdominal pathology causing this discomfort. There is no evidence of this time other acute abnormalities of the left lower extremity where he has chronic ankle pain. His been going to the touch but is not hot or red I did check the patient's [place] drug database as this is a somewhat unusual presentation given no evidence of bruising, and the patient is not a regular prescription narcotic  user. He has had 1 prescription for tramadol in the last year.   ----------------------------------------- 7:28 PM on 10/15/2015 -----------------------------------------  Patient stated that he was having hemoptysis on further questioning he did state that his sister died of a blood clot at age 59. I am not certain that I can attribute all the symptoms that he is claiming to a rib injury so I elected to do a CT scan of his chest. In addition, it is noted the patient has significant LVH and some strain pattern on his EKG. This is of some concern I do not auscultate a murmur but concern exists for LVH at a young age. He does not currently use cocaine or other recreational drugs he states. He used to use drugs sometime ago when he somewhat vague about this. If the CT scan is  negative, as anticipated, I will see if we can have him follow-up with an outpatient for his LVH. I do note that the patient had increased voltage and a EKG done many years ago as well this is an acute and likely unconnected pathology. His troponin is negative despite 3 days of constant symptoms and I see no utility in serial cardiac enzymes for this incidental finding but we will see what CT reveals and we will have him follow-up as a close outpatient.  ----------------------------------------- 7:50 PM on 10/15/2015 -----------------------------------------  Patient with negative CT scan no evidence of any pathology of any variety at this time according to radiologist. Because of his LVH ended acid like his heart measurements which appear to be within normal limits. Nonetheless, we will refer him to cardiology for this. He is here because he bumped his chest and it hurts. We'll send her home with pain medication, instructions to maintain deep breathing and close outpatient follow-up.  Clinical Course   ____________________________________________   FINAL CLINICAL IMPRESSION(S) / ED DIAGNOSES  Final diagnoses:  None      This chart was dictated using voice recognition software.  Despite best efforts to proofread,  errors can occur which can change meaning.      Jeanmarie Plant, MD 10/15/15 1846    Jeanmarie Plant, MD 10/15/15 1930    Jeanmarie Plant, MD 10/15/15 1950

## 2015-10-17 ENCOUNTER — Encounter: Payer: Self-pay | Admitting: Internal Medicine

## 2015-10-17 ENCOUNTER — Ambulatory Visit (INDEPENDENT_AMBULATORY_CARE_PROVIDER_SITE_OTHER): Payer: Self-pay | Admitting: Internal Medicine

## 2015-10-17 VITALS — BP 124/74 | HR 68 | Ht 73.0 in | Wt 156.4 lb

## 2015-10-17 DIAGNOSIS — R9431 Abnormal electrocardiogram [ECG] [EKG]: Secondary | ICD-10-CM

## 2015-10-17 NOTE — Patient Instructions (Signed)
Medication Instructions:  Your physician recommends that you continue on your current medications as directed. Please refer to the Current Medication list given to you today.   Labwork: none  Testing/Procedures: Your physician has requested that you have an echocardiogram. Echocardiography is a painless test that uses sound waves to create images of your heart. It provides your doctor with information about the size and shape of your heart and how well your heart's chambers and valves are working. This procedure takes approximately one hour. There are no restrictions for this procedure.    Follow-Up: Your physician recommends that you schedule a follow-up appointment in: 6 weeks with Dr. Okey DupreEnd   Any Other Special Instructions Will Be Listed Below (If Applicable).     If you need a refill on your cardiac medications before your next appointment, please call your pharmacy.

## 2015-10-17 NOTE — Progress Notes (Signed)
New Outpatient Visit Date: 10/17/2015  Referring Provider: Ileana Roup, MD [facility] ED  Chief Complaint: No chief complaint on file.   HPI:  Mr. Luis Terrell is a 30 y.o. year-old male with no significant past medical history, who has been referred by Dr. Alphonzo Lemmings in the ED for further evaluation of abnormal EKG. The patient presented to the [facility] ED on Sunday (2 days ago) with chest pain, shortness of breath, and difficulty speaking after running into a boat. He states that he was trying to grab a young child that had climbed into the boat and hit his left lateral chest wall against the edge of the boat. Upon arrival in the emergency department, the pain had improved, though he felt as though his breathing was still abnormal and his voice seemed hoarse. Evaluation in the emergency department was unrevealing with the exception of his EKG demonstrating possible LVH and "strain." Today, the patient reports being "90% back to normal." He feels like he still has a little bit of a sore throat when he takes a deep breath. He does not have any chest pain or dyspnea.  He denies a history of cardiac disease. Specifically, prior to striking the edge of the boat on Sunday, he has not had any chest pain, shortness of breath, palpitations, lightheadedness, or edema (except around the prior left ankle fracture and surgical fixation). As a child, he occasionally experienced a "pinch" in his chest that lasted approximately 15 seconds before resolving spontaneously. This occurred approximately every 3 months. It was not exertional and has not occurred for many years. The patient has not undergone previous cardiac workup. He is concerned about considerable anxiety and is in the process of establishing a new primary care provider.  --------------------------------------------------------------------------------------------------  Past Medical History:  Diagnosis Date  . 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 APPENDECTOMY    . FRACTURE SURGERY    . leg repair  12/17/2009    Outpatient Encounter Prescriptions as of 10/17/2015  Medication Sig  . [DISCONTINUED] cyclobenzaprine (FLEXERIL) 10 MG tablet Take 1 tablet (10 mg total) by mouth 3 (three) times daily as needed for muscle spasms.  . [DISCONTINUED] naproxen (NAPROSYN) 500 MG tablet Take 1 tablet (500 mg total) by mouth 2 (two) times daily with a meal.  . [DISCONTINUED] traMADol (ULTRAM) 50 MG tablet Take 1 tablet (50 mg total) by mouth every 6 (six) hours as needed.   No facility-administered encounter medications on file as of 10/17/2015.     Allergies: Review of patient's allergies indicates no known allergies.  Social History   Social History  . Marital status: Married    Spouse name: N/A  . Number of children: N/A  . Years of education: N/A   Occupational History  . Not on file.   Social History Main Topics  . Smoking status: Current Every Day Smoker    Packs/day: 0.75    Years: 9.00    Types: Cigarettes  . Smokeless tobacco: Never Used  . Alcohol use No  . Drug use: No  . Sexual activity: Not on file   Other Topics Concern  . Not on file   Social History Narrative  . No narrative on file    Family History  Problem Relation Age of Onset  . Clotting disorder Sister   . Heart attack Maternal Uncle   . Heart attack Maternal Grandmother   . Liver disease  Maternal Grandfather   . Sudden Cardiac Death Neg Hx     Review of Systems: A 12-system review of systems was performed and was negative except as noted in the HPI.  --------------------------------------------------------------------------------------------------  Physical Exam: BP 124/74   Pulse 68   Ht 6\' 1"  (1.854 m)   Wt 156 lb 6.4 oz (70.9 kg)   BMI 20.63 kg/m   General:  Slender man, seated comfortably in the exam room. He is accompanied by his girlfriend and her child. HEENT:  No conjunctival pallor or scleral icterus.  Moist mucous membranes.  OP clear. Neck: Supple without lymphadenopathy, thyromegaly, JVD, or HJR.  No carotid bruit. Lungs: Normal work of breathing.  Clear to auscultation bilaterally without wheezes or crackles. CV: Regular rate and rhythm without murmurs, rubs, or gallops.  Non-displaced PMI. No chest wall bruising or deformity. Abd: Bowel sounds present.  Soft, NT/ND without hepatosplenomegaly Ext: No lower extremity edema.  Radial, PT, and DP pulses are 2+ bilaterally Skin: Multiple excoriations on the face. Otherwise, no rash or lesion. Left ankle incisions appear well healed. Neuro: CNIII-XII intact.  Strength and fine-touch sensation intact in upper and lower extremities bilaterally. Psych: Normal mood and affect.  EKG:  Normal sinus rhythm with borderline LVH. Previously noted anterolateral ST segment changes are no longer present.  CTA chest (10/15/15): I have personally reviewed the images. No evidence of PE or displaced rib fracture. Minimal scarring at the lung apices noted. There are a few scattered coronary artery calcifications.  Lab Results  Component Value Date   WBC 7.1 10/15/2015   HGB 15.6 10/15/2015   HCT 44.9 10/15/2015   MCV 93.0 10/15/2015   PLT 325 10/15/2015    Lab Results  Component Value Date   NA 139 10/15/2015   K 3.1 (L) 10/15/2015   CL 103 10/15/2015   CO2 27 10/15/2015   BUN 16 10/15/2015   CREATININE 0.78 10/15/2015   GLUCOSE 119 (H) 10/15/2015    --------------------------------------------------------------------------------------------------  ASSESSMENT AND PLAN: 1. Abnormal ECG Patient was referred due to abnormal EKG in the setting of chest pain related to a chest wall injury. The pain has since resolved. His EKG today demonstrates borderline LVH. Prominent voltage in the precordial leads may be related to his young age and slender physique. Physical exam is unremarkable. He has not had any  worrisome symptoms consistent with heart failure, LVOT obstruction, or malignant arrhythmia. CTA of the chest was notable for a small amount of coronary artery calcification. However, his chest pain and EKG findings are not consistent with angina. We will proceed with transthoracic echocardiogram to evaluate for any significant structural abnormalities. I counseled the patient at length regarding the importance of smoking cessation. We will discuss further primary prevention strategies when he returns for follow-up.  Follow-up: Return to clinic in 6 weeks.  Yvonne Kendallhristopher Emad Brechtel, MD 10/17/2015 4:22 PM

## 2015-11-09 ENCOUNTER — Other Ambulatory Visit: Payer: Self-pay

## 2015-11-09 ENCOUNTER — Ambulatory Visit (INDEPENDENT_AMBULATORY_CARE_PROVIDER_SITE_OTHER): Payer: Self-pay

## 2015-11-09 DIAGNOSIS — R9431 Abnormal electrocardiogram [ECG] [EKG]: Secondary | ICD-10-CM

## 2015-11-27 NOTE — Progress Notes (Signed)
Follow-up Outpatient Visit Date: 11/28/2015  Chief Complaint: Abnormal EKG  HPI:  Luis Terrell is a 30 y.o. year-old male with no significant past medical history, who presents for follow-up of abnormal EKG and noncardiac chest pain. I first met him on 10/17/15 after a recent ED visit. He had struck his chest wall on the side of the boat and experienced chest pain. While in the emergency department, he was noted to have an abnormal EKG demonstrating possible LVH and "strain." At the time of her visit, he continued to have some mild chest soreness with deep inspiration but otherwise had no residual discomfort. Subsequent echocardiogram revealed no significant abnormalities with normal LV size, wall thickness, and ejection fraction.  Today, Luis Terrell reports that he has been without chest pain, shortness of breath, palpitations, and lightheadedness.  He has chronic swelling of the left ankle following surgery, but otherwise has no significant edema.  He is awaiting the outcome of today's visit to begin working again.  --------------------------------------------------------------------------------------------------  Cardiovascular History & Procedures: Cardiovascular Problems:  None  Risk Factors:  Tobacco use and male gender  Cath/PCI:  None  CV Surgery:  None  EP Procedures and Devices:  None  Non-Invasive Evaluation(s):  TTE (11/09/15): Normal LV size and wall thickness with EF of 60-65%. Normal wall motion and diastolic parameters. Mitral valve demonstrates systolic bowing without prolapse and trivial MR. No other significant valvular abnormalities. Normal RV size and function.  Recent CV Pertinent Labs: Lab Results  Component Value Date   K 3.1 (L) 10/15/2015   BUN 16 10/15/2015   CREATININE 0.78 10/15/2015    Past medical and surgical history were reviewed and updated in EPIC.   No outpatient encounter prescriptions on file as of 11/28/2015.   No  facility-administered encounter medications on file as of 11/28/2015.     Allergies: Review of patient's allergies indicates no known allergies.  Social History   Social History  . Marital status: Married    Spouse name: N/A  . Number of children: N/A  . Years of education: N/A   Occupational History  . Not on file.   Social History Main Topics  . Smoking status: Current Every Day Smoker    Packs/day: 0.75    Years: 9.00    Types: Cigarettes  . Smokeless tobacco: Never Used  . Alcohol use No  . Drug use: No  . Sexual activity: Not on file   Other Topics Concern  . Not on file   Social History Narrative  . No narrative on file    Family History  Problem Relation Age of Onset  . Clotting disorder Sister   . Heart attack Maternal Uncle   . Heart attack Maternal Grandmother   . Liver disease Maternal Grandfather   . Sudden Cardiac Death Neg Hx     Review of Systems: A 12-system review of systems was performed and was negative except as noted in the HPI.  -------------------------------------------------------------------------------------------------- Physical Exam: BP 132/86   Pulse 66   Ht _0  (1.854 m)   Wt 172 lb (78 kg)   BMI 22.69 kg/m   General:  Slender man, seated comfortably in the exam room. HEENT: No conjunctival pallor or scleral icterus.  Moist mucous membranes.  OP clear. Neck: Supple without lymphadenopathy, thyromegaly, JVD, or HJR. Lungs: Normal work of breathing.  Clear to auscultation bilaterally without wheezes or crackles. Heart: Regular rate and rhythm without murmurs, rubs, or gallops.  Non-displaced PMI. Abd: Bowel sounds present.  Soft, NT/ND without hepatosplenomegaly Ext: No lower extremity edema.  Radial, PT, and DP pulses are 2+ bilaterally. Skin: warm and dry without rash  EKG:  Sinus arrhythmia without significant abnormalities.  Lab Results  Component Value Date   WBC 7.1 10/15/2015   HGB 15.6 10/15/2015   HCT 44.9  10/15/2015   MCV 93.0 10/15/2015   PLT 325 10/15/2015    Lab Results  Component Value Date   NA 139 10/15/2015   K 3.1 (L) 10/15/2015   CL 103 10/15/2015   CO2 27 10/15/2015   BUN 16 10/15/2015   CREATININE 0.78 10/15/2015   GLUCOSE 119 (H) 10/15/2015   --------------------------------------------------------------------------------------------------  ASSESSMENT AND PLAN: 1.  Abnormal EKG Patient noted to have "abnormal" EKG during ED visit last month after striking his chest.  This was likely a normal variant; tracings at our last visit and again today are normal.  Recent echo did not show any significant structural abnromalities.  No further workup is needed.  2.  Non-cardiac chest pain Pain occurred after the patient struck his chest on the side of a boat.  The pain has since resolved and he has not had any further symptoms.  At this time, Luis Terrell can return to he normal activities.  No further workup is needed.  Upper normal blood pressure was noted today; the patient should follow-up with his PCP as previously arranged. The patient was encouraged to stop smoking.  Follow-up: Return to clinic as needed if new symptoms or concerns arise.  Nelva Bush, MD 11/28/2015 3:01 PM

## 2015-11-28 ENCOUNTER — Ambulatory Visit (INDEPENDENT_AMBULATORY_CARE_PROVIDER_SITE_OTHER): Payer: Self-pay | Admitting: Internal Medicine

## 2015-11-28 ENCOUNTER — Encounter: Payer: Self-pay | Admitting: Internal Medicine

## 2015-11-28 VITALS — BP 132/86 | HR 66 | Ht 73.0 in | Wt 172.0 lb

## 2015-11-28 DIAGNOSIS — R0789 Other chest pain: Secondary | ICD-10-CM

## 2015-11-28 DIAGNOSIS — R9431 Abnormal electrocardiogram [ECG] [EKG]: Secondary | ICD-10-CM

## 2015-11-28 NOTE — Patient Instructions (Signed)
Medication Instructions:  Your physician recommends that you continue on your current medications as directed. Please refer to the Current Medication list given to you today.  Labwork: NONE  Testing/Procedures: NONE  Follow-Up: Your physician wants you to follow-up as needed with Dr. Okey DupreEnd.  If you need a refill on your cardiac medications before your next appointment, please call your pharmacy.

## 2016-01-10 ENCOUNTER — Encounter: Payer: Self-pay | Admitting: Emergency Medicine

## 2016-01-10 ENCOUNTER — Emergency Department
Admission: EM | Admit: 2016-01-10 | Discharge: 2016-01-10 | Disposition: A | Discharge status: Home / Self Care | Payer: Self-pay | Attending: Emergency Medicine | Admitting: Emergency Medicine

## 2016-01-10 ENCOUNTER — Emergency Department: Payer: Self-pay

## 2016-01-10 DIAGNOSIS — Z5181 Encounter for therapeutic drug level monitoring: Secondary | ICD-10-CM | POA: Insufficient documentation

## 2016-01-10 DIAGNOSIS — R569 Unspecified convulsions: Secondary | ICD-10-CM

## 2016-01-10 DIAGNOSIS — F17210 Nicotine dependence, cigarettes, uncomplicated: Secondary | ICD-10-CM | POA: Insufficient documentation

## 2016-01-10 LAB — CBC
HCT: 42.8 % (ref 40.0–52.0)
Hemoglobin: 15.1 g/dL (ref 13.0–18.0)
MCH: 32.1 pg (ref 26.0–34.0)
MCHC: 35.2 g/dL (ref 32.0–36.0)
MCV: 91.2 fL (ref 80.0–100.0)
PLATELETS: 292 10*3/uL (ref 150–440)
RBC: 4.7 MIL/uL (ref 4.40–5.90)
RDW: 13.1 % (ref 11.5–14.5)
WBC: 12.4 10*3/uL — ABNORMAL HIGH (ref 3.8–10.6)

## 2016-01-10 LAB — URINE DRUG SCREEN, QUALITATIVE (ARMC ONLY)
Amphetamines, Ur Screen: POSITIVE — AB
Barbiturates, Ur Screen: NOT DETECTED
Benzodiazepine, Ur Scrn: POSITIVE — AB
CANNABINOID 50 NG, UR ~~LOC~~: NOT DETECTED
COCAINE METABOLITE, UR ~~LOC~~: NOT DETECTED
MDMA (ECSTASY) UR SCREEN: NOT DETECTED
Methadone Scn, Ur: NOT DETECTED
Opiate, Ur Screen: NOT DETECTED
PHENCYCLIDINE (PCP) UR S: NOT DETECTED
Tricyclic, Ur Screen: NOT DETECTED

## 2016-01-10 LAB — BASIC METABOLIC PANEL
Anion gap: 7 (ref 5–15)
BUN: 7 mg/dL (ref 6–20)
CO2: 25 mmol/L (ref 22–32)
Calcium: 9.2 mg/dL (ref 8.9–10.3)
Chloride: 105 mmol/L (ref 101–111)
Creatinine, Ser: 0.78 mg/dL (ref 0.61–1.24)
GFR calc Af Amer: >60 mL/min (ref >60)
GLUCOSE: 118 mg/dL — AB (ref 65–99)
POTASSIUM: 3.6 mmol/L (ref 3.5–5.1)
Sodium: 137 mmol/L (ref 135–145)

## 2016-01-10 LAB — ETHANOL

## 2016-01-10 NOTE — ED Notes (Signed)
Lab called in order to find out estimated time on UDS results.  Informed they are running it now. Md updated

## 2016-01-10 NOTE — Discharge Instructions (Signed)
Please do not drive or do any activities such as swimming that would put yourself or others at danger if you had another seizure. Please follow-up with the neurologist for further outpatient evaluation.

## 2016-01-10 NOTE — ED Triage Notes (Signed)
Pt comes into the ED via EMS from home c/o possible seizure.  Patient was standing in the front yard speaking with his granddad when he lost consciousness and fell backwards against the stairs.  Patient was unconscious for 3-4 minutes.  Presents postictal and was disoriented x3 when he became alert.  Patient disoriented to time.  Patient denies any history of seizures but grandfather told EMS that he was starring into space and convulsing on the stairs.

## 2016-01-10 NOTE — ED Provider Notes (Signed)
Patient care is him from Dr. Huel CoteQuigley. Patient's urine toxicology screen has resulted positive for amphetamines as well as benzodiazepines. Ethanol level is negative. Patient will be discharged home with neurology follow-up. I discussed the patient's workup with the patient. Discussed our recommendations as far as not swimming or bathing alone, not driving until cleared by neurology. Patient agreeable to plan.   Minna AntisKevin Monterio Bob, MD 01/10/16 1755

## 2016-01-10 NOTE — ED Provider Notes (Signed)
Time Seen: Approximately 1351  I have reviewed the triage notes  Chief Complaint: Seizures   History of Present Illness: Luis Terrell is a 30 y.o. male who was transported here via EMS for a possible seizure. Patient remembers standing in the yard and speaking to his grandfather and then apparently suddenly lost consciousness. He apparently fell backwards against some stairs but did not hit his head on the steps. He was unconscious for a couple of minutes and seemed to have some seizure-like activity at the scene. He apparently according to EMS had descriptions per the grandfather was not currently here to question was staring into space and having "" convulsions "". The patient denies biting his tongue, urination or defecation. He was felt to have symptoms consistent with post ictal appearance according to EMS and the patient arrives somewhat "" disoriented "". He denies any physical complaints other than feeling "" swimmy headed "". He is not aware of any history of seizures. Patient denies any history of alcohol abuse, recent alcohol consumption, illicit drug usage, or any cardiac history.  Past Medical History:  Diagnosis Date  . Leg pain, left    Chronic pain after remote fracture    Patient Active Problem List   Diagnosis Date Noted  . Abnormal EKG 11/28/2015  . Non-cardiac chest pain 11/28/2015    Past Surgical History:  Procedure Laterality Date  . APPENDECTOMY    . FRACTURE SURGERY    . leg repair  12/17/2009    Past Surgical History:  Procedure Laterality Date  . APPENDECTOMY    . FRACTURE SURGERY    . leg repair  12/17/2009      Allergies:  Patient has no known allergies.  Family History: Family History  Problem Relation Age of Onset  . Clotting disorder Sister   . Heart attack Maternal Uncle   . Heart attack Maternal Grandmother   . Liver disease Maternal Grandfather   . Sudden Cardiac Death Neg Hx     Social History: Social History   Substance Use Topics  . Smoking status: Current Every Day Smoker    Packs/day: 0.75    Years: 9.00    Types: Cigarettes  . Smokeless tobacco: Never Used  . Alcohol use No     Review of Systems:   10 point review of systems was performed and was otherwise negative:  Constitutional: No fever Eyes: No visual disturbances ENT: No sore throat, ear pain Cardiac: No chest pain Respiratory: No shortness of breath, wheezing, or stridor Abdomen: No abdominal pain, no vomiting, No diarrhea Endocrine: No weight loss, No night sweats Extremities: No peripheral edema, cyanosis Skin: No rashes, easy bruising Neurologic: No focal weakness, trouble with speech or swollowing Urologic: No dysuria, Hematuria, or urinary frequency   Physical Exam:  ED Triage Vitals [01/10/16 1322]  Enc Vitals Group     BP (!) 143/93     Pulse Rate 83     Resp 18     Temp 98.1 F (36.7 C)     Temp Source Oral     SpO2 98 %     Weight 165 lb (74.8 kg)     Height 6\' 1"  (1.854 m)     Head Circumference      Peak Flow      Pain Score 10     Pain Loc      Pain Edu?      Excl. in GC?     General: Awake , Alert , and  Oriented times 3; GCS 15 Patient's somewhat lethargic though able to answer questions appropriately Head: Normal cephalic , atraumatic Eyes: Pupils equal , round, reactive to light Nose/Throat: No nasal drainage, patent upper airway without erythema or exudate. No oral trauma Neck: Supple, Full range of motion, No anterior adenopathy or palpable thyroid masses Lungs: Clear to ascultation without wheezes , rhonchi, or rales Heart: Regular rate, regular rhythm without murmurs , gallops , or rubs Abdomen: Soft, non tender without rebound, guarding , or rigidity; bowel sounds positive and symmetric in all 4 quadrants. No organomegaly .        Extremities: 2 plus symmetric pulses. No edema, clubbing or cyanosis Neurologic: normal ambulation, Motor symmetric without deficits, sensory  intact Skin: warm, dry, no rashes   Labs:   All laboratory work was reviewed including any pertinent negatives or positives listed below:  Labs Reviewed  CBC - Abnormal; Notable for the following:       Result Value   WBC 12.4 (*)    All other components within normal limits  BASIC METABOLIC PANEL - Abnormal; Notable for the following:    Glucose, Bld 118 (*)    All other components within normal limits  URINE DRUG SCREEN, QUALITATIVE ([facility] ONLY)  ETHANOL  CBG MONITORING, ED    EKG:  ED ECG REPORT I, Jennye MoccasinBrian S Arseniy Toomey, the attending physician, personally viewed and interpreted this ECG.  Date: 01/10/2016 EKG Time: 1420Rate: 76Rhythm: normal sinus rhythm QRS Axis: normal Intervals: normal ST/T Wave abnormalities: normal Conduction Disturbances: none Narrative Interpretation: unremarkable Left ventricular hypertrophy Repolarization No acute ischemic changes and no significant change from September 2017   Radiology:  "Ct Head Wo Contrast  Result Date: 01/10/2016 CLINICAL DATA:  Possible seizures EXAM: CT HEAD WITHOUT CONTRAST TECHNIQUE: Contiguous axial images were obtained from the base of the skull through the vertex without intravenous contrast. COMPARISON:  None. FINDINGS: Brain: No evidence of acute infarction, hemorrhage, hydrocephalus, extra-axial collection or mass lesion/mass effect. Vascular: No hyperdense vessel or unexpected calcification. Skull: No osseous abnormality. Sinuses/Orbits: Visualized paranasal sinuses are clear. Visualized mastoid sinuses are clear. Visualized orbits demonstrate no focal abnormality. Other: None IMPRESSION: No acute intracranial pathology. Electronically Signed   By: Elige KoHetal  Patel   On: 01/10/2016 14:11  "  I personally reviewed the radiologic studies     ED Course:  As far the patient's stay here as been uneventful and he said no repeat episodes of seizure-like activity. A serum diagnosis is new onset seizure without any obvious  triggers at this time. Clinical Course      Assessment:  Seizure versus syncope     Plan: * Plan is discharged with current urine drug screen and alcohol level still pending.            Jennye MoccasinBrian S Harlow Carrizales, MD 01/10/16 [phone]

## 2016-07-22 ENCOUNTER — Emergency Department: Payer: Self-pay

## 2016-07-22 ENCOUNTER — Encounter: Payer: Self-pay | Admitting: *Deleted

## 2016-07-22 DIAGNOSIS — Y998 Other external cause status: Secondary | ICD-10-CM | POA: Insufficient documentation

## 2016-07-22 DIAGNOSIS — Y939 Activity, unspecified: Secondary | ICD-10-CM | POA: Insufficient documentation

## 2016-07-22 DIAGNOSIS — S0990XA Unspecified injury of head, initial encounter: Secondary | ICD-10-CM | POA: Insufficient documentation

## 2016-07-22 DIAGNOSIS — Y92018 Other place in single-family (private) house as the place of occurrence of the external cause: Secondary | ICD-10-CM | POA: Insufficient documentation

## 2016-07-22 DIAGNOSIS — F10929 Alcohol use, unspecified with intoxication, unspecified: Secondary | ICD-10-CM | POA: Insufficient documentation

## 2016-07-22 DIAGNOSIS — F17210 Nicotine dependence, cigarettes, uncomplicated: Secondary | ICD-10-CM | POA: Insufficient documentation

## 2016-07-22 DIAGNOSIS — W01198A Fall on same level from slipping, tripping and stumbling with subsequent striking against other object, initial encounter: Secondary | ICD-10-CM | POA: Insufficient documentation
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
## 2016-07-23 ENCOUNTER — Emergency Department
Admission: EM | Admit: 2016-07-23 | Discharge: 2016-07-23 | Disposition: A | Discharge status: Home / Self Care | Payer: Self-pay | Attending: Emergency Medicine | Admitting: Emergency Medicine

## 2016-07-23 DIAGNOSIS — W19XXXA Unspecified fall, initial encounter: Secondary | ICD-10-CM

## 2016-07-23 DIAGNOSIS — F10920 Alcohol use, unspecified with intoxication, uncomplicated: Secondary | ICD-10-CM

## 2016-07-23 DIAGNOSIS — S0990XA Unspecified injury of head, initial encounter: Secondary | ICD-10-CM

## 2016-07-23 MED ORDER — ACETAMINOPHEN 500 MG PO TABS
1000.0000 mg | ORAL_TABLET | Freq: Once | ORAL | Status: AC
  Administered 2016-07-23: 1000 mg via ORAL
  Filled 2016-07-23: qty 2

## 2016-07-23 MED ORDER — IBUPROFEN 400 MG PO TABS
400.0000 mg | ORAL_TABLET | Freq: Once | ORAL | Status: AC
  Administered 2016-07-23: 400 mg via ORAL
  Filled 2016-07-23: qty 1
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
## 2016-07-23 NOTE — Discharge Instructions (Signed)

## 2019-07-10 ENCOUNTER — Emergency Department
Admission: EM | Admit: 2019-07-10 | Discharge: 2019-07-10 | Disposition: A | Discharge status: Home / Self Care | Payer: No Typology Code available for payment source | Attending: Emergency Medicine | Admitting: Emergency Medicine

## 2019-07-10 ENCOUNTER — Emergency Department: Payer: No Typology Code available for payment source

## 2019-07-10 ENCOUNTER — Other Ambulatory Visit: Payer: Self-pay

## 2019-07-10 ENCOUNTER — Encounter: Payer: Self-pay | Admitting: *Deleted

## 2019-07-10 DIAGNOSIS — Y93I9 Activity, other involving external motion: Secondary | ICD-10-CM | POA: Diagnosis not present

## 2019-07-10 DIAGNOSIS — R404 Transient alteration of awareness: Secondary | ICD-10-CM

## 2019-07-10 DIAGNOSIS — F17210 Nicotine dependence, cigarettes, uncomplicated: Secondary | ICD-10-CM | POA: Insufficient documentation

## 2019-07-10 DIAGNOSIS — F1910 Other psychoactive substance abuse, uncomplicated: Secondary | ICD-10-CM | POA: Diagnosis not present

## 2019-07-10 DIAGNOSIS — R4182 Altered mental status, unspecified: Secondary | ICD-10-CM | POA: Diagnosis present

## 2019-07-10 DIAGNOSIS — Y999 Unspecified external cause status: Secondary | ICD-10-CM | POA: Diagnosis not present

## 2019-07-10 LAB — CBC WITH DIFFERENTIAL/PLATELET
Abs Immature Granulocytes: 0.03 10*3/uL (ref 0.00–0.07)
Basophils Absolute: 0.1 10*3/uL (ref 0.0–0.1)
Basophils Relative: 1 %
Eosinophils Absolute: 0.1 10*3/uL (ref 0.0–0.5)
Eosinophils Relative: 1 %
HCT: 42.3 % (ref 39.0–52.0)
Hemoglobin: 15.2 g/dL (ref 13.0–17.0)
Immature Granulocytes: 0 %
Lymphocytes Relative: 30 %
Lymphs Abs: 2.5 10*3/uL (ref 0.7–4.0)
MCH: 32.4 pg (ref 26.0–34.0)
MCHC: 35.9 g/dL (ref 30.0–36.0)
MCV: 90.2 fL (ref 80.0–100.0)
Monocytes Absolute: 1.3 10*3/uL — ABNORMAL HIGH (ref 0.1–1.0)
Monocytes Relative: 15 %
Neutro Abs: 4.5 10*3/uL (ref 1.7–7.7)
Neutrophils Relative %: 53 %
Platelets: 328 10*3/uL (ref 150–400)
RBC: 4.69 MIL/uL (ref 4.22–5.81)
RDW: 12.1 % (ref 11.5–15.5)
WBC: 8.5 10*3/uL (ref 4.0–10.5)
nRBC: 0 % (ref 0.0–0.2)

## 2019-07-10 LAB — COMPREHENSIVE METABOLIC PANEL
ALT: 23 U/L (ref 0–44)
AST: 83 U/L — ABNORMAL HIGH (ref 15–41)
Albumin: 5.1 g/dL — ABNORMAL HIGH (ref 3.5–5.0)
Alkaline Phosphatase: 66 U/L (ref 38–126)
Anion gap: 12 (ref 5–15)
BUN: 28 mg/dL — ABNORMAL HIGH (ref 6–20)
CO2: 26 mmol/L (ref 22–32)
Calcium: 9.4 mg/dL (ref 8.9–10.3)
Chloride: 98 mmol/L (ref 98–111)
Creatinine, Ser: 0.94 mg/dL (ref 0.61–1.24)
GFR calc Af Amer: >60 mL/min (ref >60)
GFR calc non Af Amer: >60 mL/min (ref >60)
Glucose, Bld: 100 mg/dL — ABNORMAL HIGH (ref 70–99)
Potassium: 3.6 mmol/L (ref 3.5–5.1)
Sodium: 136 mmol/L (ref 135–145)
Total Bilirubin: 2.1 mg/dL — ABNORMAL HIGH (ref 0.3–1.2)
Total Protein: 7.9 g/dL (ref 6.5–8.1)

## 2019-07-10 LAB — URINE DRUG SCREEN, QUALITATIVE (ARMC ONLY)
Amphetamines, Ur Screen: POSITIVE — AB
Barbiturates, Ur Screen: NOT DETECTED
Benzodiazepine, Ur Scrn: POSITIVE — AB
Cannabinoid 50 Ng, Ur ~~LOC~~: POSITIVE — AB
Cocaine Metabolite,Ur ~~LOC~~: POSITIVE — AB
MDMA (Ecstasy)Ur Screen: NOT DETECTED
Methadone Scn, Ur: NOT DETECTED
Opiate, Ur Screen: NOT DETECTED
Phencyclidine (PCP) Ur S: NOT DETECTED
Tricyclic, Ur Screen: NOT DETECTED

## 2019-07-10 LAB — ETHANOL: Alcohol, Ethyl (B): <10 mg/dL (ref <10)
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
## 2019-07-10 NOTE — ED Notes (Signed)
Cardiac leads placed on patient. Heart rate 69, pulse ox 67. Patient is alert and oriented to self and place. Patient is unsure about how he got here last night.

## 2019-07-10 NOTE — Discharge Instructions (Addendum)
Your medical work-up was reassuring except that you tested positive for at least 4 different drugs.  Please look through the included information about ways to get help for your substance abuse issues.

## 2019-07-10 NOTE — ED Notes (Signed)
Patient was given phone to call for ride home.

## 2019-07-10 NOTE — ED Notes (Signed)
Hourly rounding reveals patient sleeping in hallway bed. No complaints, stable, in no acute distress. Q15 minute rounds and monitoring to continue.

## 2019-09-13 ENCOUNTER — Emergency Department
Admission: EM | Admit: 2019-09-13 | Discharge: 2019-09-15 | Disposition: A | Discharge status: Other Acute Inpatient Hospital | Payer: Self-pay | Attending: Emergency Medicine | Admitting: Emergency Medicine

## 2019-09-13 ENCOUNTER — Encounter: Payer: Self-pay | Admitting: Emergency Medicine

## 2019-09-13 DIAGNOSIS — F17210 Nicotine dependence, cigarettes, uncomplicated: Secondary | ICD-10-CM | POA: Insufficient documentation

## 2019-09-13 DIAGNOSIS — F29 Unspecified psychosis not due to a substance or known physiological condition: Secondary | ICD-10-CM | POA: Insufficient documentation

## 2019-09-13 DIAGNOSIS — F1520 Other stimulant dependence, uncomplicated: Secondary | ICD-10-CM | POA: Insufficient documentation

## 2019-09-13 DIAGNOSIS — Z20822 Contact with and (suspected) exposure to covid-19: Secondary | ICD-10-CM | POA: Insufficient documentation

## 2019-09-13 DIAGNOSIS — F10980 Alcohol use, unspecified with alcohol-induced anxiety disorder: Secondary | ICD-10-CM | POA: Insufficient documentation

## 2019-09-13 DIAGNOSIS — F1910 Other psychoactive substance abuse, uncomplicated: Secondary | ICD-10-CM | POA: Insufficient documentation

## 2019-09-13 LAB — COMPREHENSIVE METABOLIC PANEL
ALT: 34 U/L (ref 0–44)
AST: 26 U/L (ref 15–41)
Albumin: 5.1 g/dL — ABNORMAL HIGH (ref 3.5–5.0)
Alkaline Phosphatase: 72 U/L (ref 38–126)
Anion gap: 10 (ref 5–15)
BUN: 8 mg/dL (ref 6–20)
CO2: 29 mmol/L (ref 22–32)
Calcium: 9.6 mg/dL (ref 8.9–10.3)
Chloride: 100 mmol/L (ref 98–111)
Creatinine, Ser: 0.62 mg/dL (ref 0.61–1.24)
GFR calc Af Amer: >60 mL/min (ref >60)
GFR calc non Af Amer: >60 mL/min (ref >60)
Glucose, Bld: 108 mg/dL — ABNORMAL HIGH (ref 70–99)
Potassium: 3.8 mmol/L (ref 3.5–5.1)
Sodium: 139 mmol/L (ref 135–145)
Total Bilirubin: 1.1 mg/dL (ref 0.3–1.2)
Total Protein: 8 g/dL (ref 6.5–8.1)

## 2019-09-13 LAB — URINE DRUG SCREEN, QUALITATIVE (ARMC ONLY)
Amphetamines, Ur Screen: NOT DETECTED
Barbiturates, Ur Screen: NOT DETECTED
Benzodiazepine, Ur Scrn: NOT DETECTED
Cannabinoid 50 Ng, Ur ~~LOC~~: NOT DETECTED
Cocaine Metabolite,Ur ~~LOC~~: NOT DETECTED
MDMA (Ecstasy)Ur Screen: NOT DETECTED
Methadone Scn, Ur: NOT DETECTED
Opiate, Ur Screen: NOT DETECTED
Phencyclidine (PCP) Ur S: NOT DETECTED
Tricyclic, Ur Screen: NOT DETECTED

## 2019-09-13 LAB — CBC
HCT: 46.1 % (ref 39.0–52.0)
Hemoglobin: 15.9 g/dL (ref 13.0–17.0)
MCH: 32.9 pg (ref 26.0–34.0)
MCHC: 34.5 g/dL (ref 30.0–36.0)
MCV: 95.4 fL (ref 80.0–100.0)
Platelets: 321 10*3/uL (ref 150–400)
RBC: 4.83 MIL/uL (ref 4.22–5.81)
RDW: 12.3 % (ref 11.5–15.5)
WBC: 7.5 10*3/uL (ref 4.0–10.5)
nRBC: 0 % (ref 0.0–0.2)

## 2019-09-13 LAB — SALICYLATE LEVEL: Salicylate Lvl: <7 mg/dL — ABNORMAL LOW (ref 7.0–30.0)

## 2019-09-13 LAB — ACETAMINOPHEN LEVEL: Acetaminophen (Tylenol), Serum: <10 ug/mL — ABNORMAL LOW (ref 10–30)

## 2019-09-13 LAB — SARS CORONAVIRUS 2 BY RT PCR (HOSPITAL ORDER, PERFORMED IN ~~LOC~~ HOSPITAL LAB): SARS Coronavirus 2: NEGATIVE

## 2019-09-13 LAB — ETHANOL: Alcohol, Ethyl (B): <10 mg/dL (ref <10)

## 2019-09-13 NOTE — ED Notes (Signed)
Patient dressed into hospital appropriate scrubs by this RN and officer in room.  Belongings placed into 1 of 1 bag.  Contents include:  1 pair camo slippers, 1 pair white socks, 1 gray t shirt, 1 plaid pants, 1 blue belt, 1 pair white underwear, 1 medical mask, 1 black cell phone, 2 white rings, 1 plastic bag with phone charger and other belongings, bag not opened in triage.

## 2019-09-13 NOTE — ED Provider Notes (Signed)
[facility] Emergency Department Provider Note  Time seen: 9:14 PM  I have reviewed the triage vital signs and the nursing notes.   HISTORY  Chief Complaint Mental Health Problem   HPI Luis Terrell is a 34 y.o. male with a past medical history of substance use presents to the emergency department under IVC.  According to the IVC taken out by the patient's grandmother with whom he lives the patient has been using an increased amount of substances, at times believes things are watching him like the furnace.  States police have been called out for possible explosives but none were found.  Here the patient is quite agitated refusing to answer some questions at times.  States it is not fair that someone can just locked him up in the emergency department.  I attempted to explain to the patient that he is under an IVC and he needs to see a psychiatrist.  Patient is reluctantly agreeable.  Continues to ask me for methadone but admits that he has no active prescription for methadone nor does he see a clinic.  Past Medical History:  Diagnosis Date  . Leg pain, left    Chronic pain after remote fracture    Patient Active Problem List   Diagnosis Date Noted  . Seizure (HCC) 01/10/2016  . Abnormal EKG 11/28/2015  . Non-cardiac chest pain 11/28/2015    Past Surgical History:  Procedure Laterality Date  . APPENDECTOMY    . FRACTURE SURGERY    . leg repair  12/17/2009    Prior to Admission medications   Not on File    No Known Allergies  Family History  Problem Relation Age of Onset  . Clotting disorder Sister   . Heart attack Maternal Uncle   . Heart attack Maternal Grandmother   . Liver disease Maternal Grandfather   . Sudden Cardiac Death Neg Hx     Social History Social History   Tobacco Use  . Smoking status: Current Every Day Smoker    Packs/day: 0.75    Years: 9.00    Pack years: 6.75    Types: Cigarettes  . Smokeless tobacco: Never Used   Substance Use Topics  . Alcohol use: No  . Drug use: No    Review of Systems Constitutional: Negative for fever Cardiovascular: Negative for chest pain. Respiratory: Negative for shortness of breath. Gastrointestinal: Negative for abdominal pain Musculoskeletal: Negative for musculoskeletal complaints Neurological: Negative for headache All other ROS negative  ____________________________________________   PHYSICAL EXAM:  VITAL SIGNS: ED Triage Vitals [09/13/19 1935]  Enc Vitals Group     BP (!) 141/78     Pulse Rate 81     Resp 20     Temp 98.2 F (36.8 C)     Temp Source Oral     SpO2 100 %     Weight 165 lb (74.8 kg)     Height      Head Circumference      Peak Flow      Pain Score      Pain Loc      Pain Edu?      Excl. in [place]?    Constitutional: Patient is awake and alert, no acute distress.  Calm but at times unwilling to answer questions. Eyes: Normal exam ENT      Head: Normocephalic and atraumatic.      Mouth/Throat: Mucous membranes are moist. Cardiovascular: Normal rate, regular rhythm. No murmurs, rubs, or gallops. Respiratory: Normal  respiratory effort without tachypnea nor retractions. Breath sounds are clear  Gastrointestinal: Soft and nontender. No distention.   Musculoskeletal: Nontender with normal range of motion in all extremities.  Neurologic:  Normal speech and language. No gross focal neurologic deficits  Skin:  Skin is warm, dry and intact.  Psychiatric: Mood and affect are normal.    INITIAL IMPRESSION / ASSESSMENT AND PLAN / ED COURSE  Pertinent labs & imaging results that were available during my care of the patient were reviewed by me and considered in my medical decision making (see chart for details).   Patient presents to the emergency department under IVC for substance use and concern for possible psychosis.  Possibly substance induced psychosis.  Currently patient is calm, admits to methamphetamine use but last use was 3 days  ago per patient.  Has no medical complaints.  Patient is agitated that he is being held here until psychiatry can see him.  We will check labs and closely monitor while awaiting psychiatric evaluation.  Urine drug screen negative.  Awaiting blood work.  And psychiatric evaluation.  Luis Terrell was evaluated in Emergency Department on 09/13/2019 for the symptoms described in the history of present illness. He was evaluated in the context of the global COVID-19 pandemic, which necessitated consideration that the patient might be at risk for infection with the SARS-CoV-2 virus that causes COVID-19. Institutional protocols and algorithms that pertain to the evaluation of patients at risk for COVID-19 are in a state of rapid change based on information released by regulatory bodies including the CDC and federal and state organizations. These policies and algorithms were followed during the patient's care in the ED.  ____________________________________________   FINAL CLINICAL IMPRESSION(S) / ED DIAGNOSES  Substance use Psychosis   Minna Antis, MD 09/13/19 2144

## 2019-09-13 NOTE — ED Triage Notes (Signed)
Pt arrived via [employer] from [employer] with IVC paperwork. Per affidavit, pt lives with grandparents and they reported that pt is continuously on drugs, has been having paranoia, hallucinations and last night told the grandmother that he was going to blow his brains out and that it would be her fault. Grandmother reports pt has had seizures with unknown cause to why. Pt in triage is calm and cooperative. Pt denies SI and HI. Pt admits to using meth regularly, last use 3 days ago.

## 2019-09-13 NOTE — ED Notes (Signed)
Pt. Transferred to [facility] from ED to room 3 after screening for contraband. Report to include Situation, Background, Assessment and Recommendations from Kim RN. Pt. Oriented to unit including Q15 minute rounds as well as the security cameras for their protection. Patient is alert and oriented, warm and dry in no acute distress. Patient denies SI, HI, and AVH. Pt. Encouraged to let me know if needs arise. 

## 2019-09-14 MED ORDER — DIPHENHYDRAMINE HCL 50 MG/ML IJ SOLN: 50.0000 mg | Freq: Four times a day (QID) | INTRAMUSCULAR | Status: DC | PRN

## 2019-09-14 MED ORDER — DIPHENHYDRAMINE HCL 12.5 MG/5ML PO ELIX
25.0000 mg | ORAL_SOLUTION | Freq: Four times a day (QID) | ORAL | Status: DC | PRN
  Administered 2019-09-14: 25 mg via ORAL
  Filled 2019-09-14 (×4): qty 10

## 2019-09-14 MED ORDER — OLANZAPINE 10 MG IM SOLR: 10.0000 mg | Freq: Three times a day (TID) | INTRAMUSCULAR | Status: DC | PRN

## 2019-09-14 MED ORDER — OLANZAPINE 10 MG PO TABS
10.0000 mg | ORAL_TABLET | Freq: Three times a day (TID) | ORAL | Status: DC | PRN
  Filled 2019-09-14: qty 1

## 2019-09-14 NOTE — BH Assessment (Signed)
Assessment Note  Luis Terrell is an 34 y.o. male presenting to [facility] ED under IVC given by [facility]. Per triage note Pt arrived via [employer] from [facility] with IVC paperwork. Per affidavit, pt lives with grandparents and they reported that pt is continuously on drugs, has been having paranoia, hallucinations and last night told the grandmother that he was going to blow his brains out and that it would be her fault. Grandmother reports pt has had seizures with unknown cause to why. Pt in triage is calm and cooperative. Pt denies SI and HI. Pt admits to using meth regularly, last use 3 days ago. During assessment patient appears alert and oriented, calm and cooperative. When asked why patient was presenting to ED patient reported "2 officers picked me up because my grandmother made accusations." Patient denies AH/VH, he denies any paranoia, currently denies SI/HI. Patient denies any attempts to hurt himself or anyone else in the past. Patient denies any outpatient treatment and denies a mental health history. Patient does report a substance abuse history and reports using Methamphetamines and Alcohol. Patient reported that he hasn't used Meth in "3 days" and reports drinking Alcohol "beer yesterday, I had 3 beers." Patient reported when he does use Methamphetamines "I used 2-3 times a day" and reports a history of daily use of alcohol. Patient UDS and BAL are negative. Patient reports that he lives with his grandmother and grandmother wants him to stop using. Patient denies SI/HI/AH/VH and is not currently responding to any internal or external stimuli.  Per Psyc NP Nira Conn patient will be observed overnight and reassessed in the morning  Diagnosis: Stimulant Use Disorder-Severe, Alcohol use disorder-Severe  Past Medical History:  Past Medical History:  Diagnosis Date  . Leg pain, left    Chronic pain after remote fracture    Past Surgical History:  Procedure Laterality Date  . APPENDECTOMY     . FRACTURE SURGERY    . leg repair  12/17/2009    Family History:  Family History  Problem Relation Age of Onset  . Clotting disorder Sister   . Heart attack Maternal Uncle   . Heart attack Maternal Grandmother   . Liver disease Maternal Grandfather   . Sudden Cardiac Death Neg Hx     Social History:  reports that he has been smoking cigarettes. He has a 6.75 pack-year smoking history. He has never used smokeless tobacco. He reports that he does not drink alcohol and does not use drugs.  Additional Social History:  Alcohol / Drug Use Pain Medications: See MAR Prescriptions: See MAR Over the Counter: See MAR History of alcohol / drug use?: Yes Substance #1 Name of Substance 1: Methamphetamine Substance #2 Name of Substance 2: Alcohol  CIWA: CIWA-Ar BP: (!) 141/78 Pulse Rate: 81 COWS:    Allergies: No Known Allergies  Home Medications: (Not in a hospital admission)   OB/GYN Status:  No LMP for male patient.  General Assessment Data Location of Assessment: [facility] ED TTS Assessment: In system Is this a Tele or Face-to-Face Assessment?: Face-to-Face Is this an Initial Assessment or a Re-assessment for this encounter?: Initial Assessment Patient Accompanied by:: N/A Language Other than English: No Living Arrangements: Other (Comment) What gender do you identify as?: Male Marital status: Single Pregnancy Status: No Living Arrangements: Other relatives Can pt return to current living arrangement?: Yes Admission Status: Involuntary Petitioner: Other Is patient capable of signing voluntary admission?: No Referral Source: Other Insurance type: None  Medical Screening Exam [facility]  Walk-in ONLY) Medical Exam completed: Yes  Crisis Care Plan Living Arrangements: Other relatives Legal Guardian: Other: (Self) Name of Psychiatrist: None Name of Therapist: None  Education Status Is patient currently in school?: No Is the patient employed, unemployed or receiving  disability?: Unemployed  Risk to self with the past 6 months Suicidal Ideation: No Has patient been a risk to self within the past 6 months prior to admission? : No Suicidal Intent: No Has patient had any suicidal intent within the past 6 months prior to admission? : No Is patient at risk for suicide?: No Suicidal Plan?: No Has patient had any suicidal plan within the past 6 months prior to admission? : No Access to Means: No What has been your use of drugs/alcohol within the last 12 months?: Methamphetamine, Alcohol Previous Attempts/Gestures: No How many times?: 0 Other Self Harm Risks: None Triggers for Past Attempts: None known Intentional Self Injurious Behavior: None Family Suicide History: No Recent stressful life event(s): Other (Comment) (None reported) Persecutory voices/beliefs?: No Depression: No Substance abuse history and/or treatment for substance abuse?: Yes Suicide prevention information given to non-admitted patients: Not applicable  Risk to Others within the past 6 months Homicidal Ideation: No Does patient have any lifetime risk of violence toward others beyond the six months prior to admission? : No Thoughts of Harm to Others: No Current Homicidal Intent: No Current Homicidal Plan: No Access to Homicidal Means: No Identified Victim: None History of harm to others?: No Assessment of Violence: None Noted Violent Behavior Description: None Does patient have access to weapons?: No Criminal Charges Pending?: No Does patient have a court date: No Is patient on probation?: No  Psychosis Hallucinations: None noted Delusions: None noted  Mental Status Report Appearance/Hygiene: In scrubs Eye Contact: Good Motor Activity: Freedom of movement Speech: Logical/coherent Level of Consciousness: Alert Mood: Pleasant Affect: Appropriate to circumstance Anxiety Level: None Thought Processes: Coherent Judgement: Unimpaired Orientation: Person, Place, Time,  Situation, Appropriate for developmental age Obsessive Compulsive Thoughts/Behaviors: None  Cognitive Functioning Concentration: Normal Memory: Recent Intact, Remote Intact Is patient IDD: No Insight: Fair Impulse Control: Fair Appetite: Fair Have you had any weight changes? : No Change Sleep: No Change Total Hours of Sleep: 6 Vegetative Symptoms: None  ADLScreening [facility] Assessment Services) Patient's cognitive ability adequate to safely complete daily activities?: Yes Patient able to express need for assistance with ADLs?: Yes Independently performs ADLs?: Yes (appropriate for developmental age)  Prior Inpatient Therapy Prior Inpatient Therapy: No  Prior Outpatient Therapy Prior Outpatient Therapy: No Does patient have an ACCT team?: No Does patient have Intensive In-House Services?  : No Does patient have [facility]arch services? : No Does patient have P4CC services?: No  ADL Screening (condition at time of admission) Patient's cognitive ability adequate to safely complete daily activities?: Yes Is the patient deaf or have difficulty hearing?: No Does the patient have difficulty seeing, even when wearing glasses/contacts?: No Does the patient have difficulty concentrating, remembering, or making decisions?: No Patient able to express need for assistance with ADLs?: Yes Does the patient have difficulty dressing or bathing?: No Independently performs ADLs?: Yes (appropriate for developmental age) Does the patient have difficulty walking or climbing stairs?: No Weakness of Legs: None Weakness of Arms/Hands: None  Home Assistive Devices/Equipment Home Assistive Devices/Equipment: None  Therapy Consults (therapy consults require a physician order) PT Evaluation Needed: No OT Evalulation Needed: No SLP Evaluation Needed: No  Disposition: Per Psyc NP Nira Conn patient will be observed overnight and reassessed in the morning Disposition Initial  Assessment Completed for this Encounter: Yes  On Site Evaluation by:   Reviewed with Physician:    Benay Pike MS LCASA 09/14/2019 2:29 AM

## 2019-09-14 NOTE — ED Notes (Signed)
Hourly rounding reveals patient sleeping in room. No complaints, stable, in no acute distress. Q15 minute rounds and monitoring via Security Cameras to continue. 

## 2019-09-14 NOTE — ED Notes (Signed)
Hourly rounding reveals patient awake in room. No complaints, stable, in no acute distress. Q15 minute rounds and monitoring via Security Cameras to continue. 

## 2019-09-14 NOTE — Consult Note (Signed)
[facility]   Reason for Consult: ---severe --agitation  Paranoia / threats to harm self ---and others Under the influence of Amphetamines   Amphetamine intoxication is the issue along with personality disorder -- IED and    Referring Physician:  --ER MD    Patient Identification: Luis Terrell MRN:  [id number] Principal Diagnosis: Diagnosis:    Amphetamine intoxication --  Total Time spent with patient: one hour   Subjective:   Luis Terrell is a 34 y.o. 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    Now needs inpatient at least several days due to danger and risk and need for med mgt in a structured setting ---  Grandparents --are worried about violence and harm to others.  Whole family ---is fearful of him harming them   Remains inpatient until --further stabilization        Past Psychiatric History:  No other new admissions, detox rehab or other factors   Risk to Self: Suicidal Ideation: No Suicidal Intent: No Is patient at risk for suicide?: No Suicidal Plan?: No Access to Means: No What has been your use of drugs/alcohol within the last 12 months?: Methamphetamine, Alcohol How many times?: 0 Other Self Harm Risks: None Triggers for Past Attempts: None known Intentional Self Injurious Behavior: None Risk to Others: Homicidal Ideation: No Thoughts of Harm to Others: No Current Homicidal Intent: No Current Homicidal Plan: No Access to Homicidal Means: No Identified Victim: None History of harm to others?: No Assessment of Violence: None Noted Violent Behavior Description: None Does patient have access to weapons?: No Criminal Charges Pending?: No Does patient have a court date:  No Prior Inpatient Therapy: Prior Inpatient Therapy: No Prior Outpatient Therapy: Prior Outpatient Therapy: No Does patient have an ACCT team?: No Does patient have Intensive In-House Services?  : No Does patient have [facility] services? : No Does patient have P4CC services?: No  Past Medical History:  Past Medical History:  Diagnosis Date  . Leg pain, left    Chronic pain after remote fracture    Past Surgical History:  Procedure Laterality Date  . APPENDECTOMY    . FRACTURE SURGERY    . leg repair  12/17/2009   Family History:  Family History  Problem Relation Age of Onset  . Clotting disorder Sister   . Heart attack Maternal Uncle   . Heart attack Maternal Grandmother   . Liver disease Maternal Grandfather   . Sudden Cardiac Death Neg Hx    Family Psychiatric  History: parents with bipolar disorder and severe substance dependence ---estranged from them  Grandparents ---raised him and the two other siblings.   Social History:  Not working, amphetamines increasing, grandparents applying for disability  Social History   Substance and Sexual Activity  Alcohol Use No     Social History   Substance and Sexual Activity  Drug Use No    Social History   Socioeconomic History  . Marital status: Divorced    Spouse name: Not on file  . Number of children: Not on file  . Years of education: Not on file  . Highest education level: Not on file  Occupational History  . Not on file  Tobacco Use  . Smoking status: Current Every Day Smoker  Packs/day: 0.75    Years: 9.00    Pack years: 6.75    Types: Cigarettes  . Smokeless tobacco: Never Used  Substance and Sexual Activity  . Alcohol use: No  . Drug use: No  . Sexual activity: Not on file  Other Topics Concern  . Not on file  Social History Narrative  . Not on file   Social Determinants of Health   Financial Resource Strain:   . Difficulty of Paying Living Expenses:   Food Insecurity:   . Worried About  Programme researcher, broadcasting/film/video in the Last Year:   . Barista in the Last Year:   Transportation Needs:   . Freight forwarder (Medical):   Marland Kitchen Lack of Transportation (Non-Medical):   Physical Activity:   . Days of Exercise per Week:   . Minutes of Exercise per Session:   Stress:   . Feeling of Stress :   Social Connections:   . Frequency of Communication with Friends and Family:   . Frequency of Social Gatherings with Friends and Family:   . Attends Religious Services:   . Active Member of Clubs or Organizations:   . Attends Banker Meetings:   Marland Kitchen Marital Status:    Additional Social History:  He is not cooperative and wants to leave but will remain in house for treatment on IVC  No safety margin established for release Grandparents will tell him he cannot come back     Allergies:  No Known Allergies  Labs:  Results for orders placed or performed during the hospital encounter of 09/13/19 (from the past 48 hour(s))  Urine Drug Screen, Qualitative     Status: None   Collection Time: 09/13/19  8:43 PM  Result Value Ref Range   Tricyclic, Ur Screen NONE DETECTED NONE DETECTED   Amphetamines, Ur Screen NONE DETECTED NONE DETECTED   MDMA (Ecstasy)Ur Screen NONE DETECTED NONE DETECTED   Cocaine Metabolite,Ur [place] NONE DETECTED NONE DETECTED   Opiate, Ur Screen NONE DETECTED NONE DETECTED   Phencyclidine (PCP) Ur S NONE DETECTED NONE DETECTED   Cannabinoid 50 Ng, Ur [place] NONE DETECTED NONE DETECTED   Barbiturates, Ur Screen NONE DETECTED NONE DETECTED   Benzodiazepine, Ur Scrn NONE DETECTED NONE DETECTED   Methadone Scn, Ur NONE DETECTED NONE DETECTED    Comment: (NOTE) Tricyclics + metabolites, urine    Cutoff 1000 ng/mL Amphetamines + metabolites, urine  Cutoff 1000 ng/mL MDMA (Ecstasy), urine              Cutoff 500 ng/mL Cocaine Metabolite, urine          Cutoff 300 ng/mL Opiate + metabolites, urine        Cutoff 300 ng/mL Phencyclidine (PCP), urine         Cutoff  25 ng/mL Cannabinoid, urine                 Cutoff 50 ng/mL Barbiturates + metabolites, urine  Cutoff 200 ng/mL Benzodiazepine, urine              Cutoff 200 ng/mL Methadone, urine                   Cutoff 300 ng/mL  The urine drug screen provides only a preliminary, unconfirmed analytical test result and should not be used for non-medical purposes. Clinical consideration and professional judgment should be applied to any positive drug screen result due to possible interfering substances. A more specific alternate chemical method  must be used in order to obtain a confirmed analytical result. Gas chromatography / mass spectrometry (GC/MS) is the preferred confirm atory method. Performed at [facility], [address] [place], [place] [address]   Comprehensive metabolic panel     Status: Abnormal   Collection Time: 09/13/19  9:50 PM  Result Value Ref Range   Sodium 139 135 - 145 mmol/L   Potassium 3.8 3.5 - 5.1 mmol/L   Chloride 100 98 - 111 mmol/L   CO2 29 22 - 32 mmol/L   Glucose, Bld 108 (H) 70 - 99 mg/dL    Comment: Glucose reference range applies only to samples taken after fasting for at least 8 hours.   BUN 8 6 - 20 mg/dL   Creatinine, Ser 6.04 0.61 - 1.24 mg/dL   Calcium 9.6 8.9 - 54.0 mg/dL   Total Protein 8.0 6.5 - 8.1 g/dL   Albumin 5.1 (H) 3.5 - 5.0 g/dL   AST 26 15 - 41 U/L   ALT 34 0 - 44 U/L   Alkaline Phosphatase 72 38 - 126 U/L   Total Bilirubin 1.1 0.3 - 1.2 mg/dL   GFR calc non Af Amer >60 >60 mL/min   GFR calc Af Amer >60 >60 mL/min   Anion gap 10 5 - 15    Comment: Performed at [facility], [address] [place], [place] [address]  Ethanol     Status: None   Collection Time: 09/13/19  9:50 PM  Result Value Ref Range   Alcohol, Ethyl (B) <10 <10 mg/dL    Comment: (NOTE) Lowest detectable limit for serum alcohol is 10 mg/dL.  For medical purposes only. Performed at [facility], [address] [place], [place]  [address]   Salicylate level     Status: Abnormal   Collection Time: 09/13/19  9:50 PM  Result Value Ref Range   Salicylate Lvl <7.0 (L) 7.0 - 30.0 mg/dL    Comment: Performed at [facility], [address] [place], [place] [address]  Acetaminophen level     Status: Abnormal   Collection Time: 09/13/19  9:50 PM  Result Value Ref Range   Acetaminophen (Tylenol), Serum <10 (L) 10 - 30 ug/mL    Comment: (NOTE) Therapeutic concentrations vary significantly. A range of 10-30 ug/mL  may be an effective concentration for many patients. However, some  are best treated at concentrations outside of this range. Acetaminophen concentrations >150 ug/mL at 4 hours after ingestion  and >50 ug/mL at 12 hours after ingestion are often associated with  toxic reactions.  Performed at [facility], [address] [place], [place] [address]   cbc     Status: None   Collection Time: 09/13/19  9:50 PM  Result Value Ref Range   WBC 7.5 4.0 - 10.5 K/uL   RBC 4.83 4.22 - 5.81 MIL/uL   Hemoglobin 15.9 13.0 - 17.0 g/dL   HCT 78.4 39 - 52 %   MCV 95.4 80.0 - 100.0 fL   MCH 32.9 26.0 - 34.0 pg   MCHC 34.5 30.0 - 36.0 g/dL   RDW 69.6 29.5 - 28.4 %   Platelets 321 150 - 400 K/uL   nRBC 0.0 0.0 - 0.2 %    Comment: Performed at [facility], [address], [place], [place] [address]  SARS Coronavirus 2 by RT PCR (hospital order, performed in [facility] hospital lab) Nasopharyngeal Nasopharyngeal Swab     Status: None  Collection Time: 09/13/19  9:50 PM   Specimen: Nasopharyngeal Swab  Result Value Ref Range   SARS Coronavirus 2 NEGATIVE NEGATIVE    Comment: (NOTE) SARS-CoV-2 target nucleic acids are NOT DETECTED.  The SARS-CoV-2 RNA is generally detectable in upper and lower respiratory specimens during the acute phase of infection. The lowest concentration of SARS-CoV-2 viral copies this assay can detect is 250 copies / mL. A negative result does not preclude SARS-CoV-2  infection and should not be used as the sole basis for treatment or other patient management decisions.  A negative result may occur with improper specimen collection / handling, submission of specimen other than nasopharyngeal swab, presence of viral mutation(s) within the areas targeted by this assay, and inadequate number of viral copies (<250 copies / mL). A negative result must be combined with clinical observations, patient history, and epidemiological information.  Fact Sheet for Patients:   BoilerBrush.com.cy  Fact Sheet for Healthcare Providers: https://pope.com/  This test is not yet approved or  cleared by the [place] FDA and has been authorized for detection and/or diagnosis of SARS-CoV-2 by FDA under an Emergency Use Authorization (EUA).  This EUA will remain in effect (meaning this test can be used) for the duration of the COVID-19 declaration under Section 564(b)(1) of the Act, 21 U.S.C. section 360bbb-3(b)(1), unless the authorization is terminated or revoked sooner.  Performed at [facility], [address], [place], [place] [address]     Current Facility-Administered Medications  Medication Dose Route Frequency Provider Last Rate Last Admin  . diphenhydrAMINE (BENADRYL) 12.5 MG/5ML elixir 25 mg  25 mg Oral Q6H PRN Roselind Messier, MD      . diphenhydrAMINE (BENADRYL) injection 50 mg  50 mg Intramuscular Q6H PRN Roselind Messier, MD      . OLANZapine [facility]) injection 10 mg  10 mg Intramuscular TID PRN Roselind Messier, MD      . OLANZapine [facility]) tablet 10 mg  10 mg Oral TID PRN Roselind Messier, MD       No current outpatient medications on file.    Musculoskeletal: Strength & Muscle Tone: normal  Gait & Station: normal  Patient leans:  N/a   Psychiatric Specialty Exam: Physical Exam  Review of Systems  Blood pressure (!) 141/78, pulse 81, temperature 98.2 F (36.8 C), temperature  source Oral, resp. rate 20, weight 74.8 kg, SpO2 100 %.Body mass index is 21.77 kg/m.  Mental Status  Thin male oriented times four looks haggard unkept sickly thin Gaunt  Rapport poor eye contact poor Impulsive, says aggressive statements Oriented to person place date and time Consciousness not fluctuant or clouded  Concentration and attention fair  No movements, shakes tremors tics Judgement insight reliability all poor Memory remote recent and immediate normal Speech --normal rate tone fluency  Mood and affect --angry anxious  Abstraction somewhat concrete Fund of knowledge intelligence average to below average Thought process and content --violent themes, wants to harm others, threats to act out so he can go to jail  Paranoid fearful suspicious  SI and HI --active IVC reports for both, no clarity or reliability to discharge him at this time     Cognition diminished ADL's slowed down with drugs Aims not done Sleep erratic Handedness not known Leans not known  Language normal  Recall normal  Treatment Plan Summary:   Patient remains here pending bed transfer on IVC --he has violence potential and remains at risk.   Grand mom will tell him he cannot come home due to extreme substance use and also his threats to them and himself   He is resistant to inpatient but IVC remains in effect  I suggest referring him out as he might be too violent a potential for our unit so TTS will refer him out   I will start regular meds but he is not cooperative right now.  I did right for emergency prn meds in case he acts out and escalates    Disposition:   Awaits Psych bed transfer on IVC   Roselind Messier, MD 09/14/2019 1:10 PM

## 2019-09-14 NOTE — ED Notes (Signed)
Pt got agitated after speaking to the psychiatrist stating he is not going to stay here and wants to go home, yelling and pacing out of room. Psych MD ordered some meds for agitation, pt took the benadryl but refused the olanzapine. MD notified, no new orders received. Pt also assisted to call and speak with grandmother, heard arguing with her on the phone but was able to remain calm.

## 2019-09-14 NOTE — ED Notes (Signed)
Pt denies SI/HI/AVH. Awaiting psych re-evaluation, informed of this and he verbalized understanding.

## 2019-09-14 NOTE — ED Notes (Signed)
Pt dinner tray set at bedside.

## 2019-09-14 NOTE — ED Notes (Signed)
Report to include Situation, Background, Assessment, and Recommendations received from Dorothy RN. Patient alert and oriented, warm and dry, in no acute distress. Patient denies SI, HI, AVH and pain. Patient made aware of Q15 minute rounds and security cameras for their safety. Patient instructed to come to me with needs or concerns.  

## 2019-09-14 NOTE — ED Notes (Signed)
Hourly rounding reveals patient in room. No complaints, stable, in no acute distress. Q15 minute rounds and monitoring via Security Cameras to continue. 

## 2019-09-14 NOTE — ED Notes (Signed)
Snack and beverage given. 

## 2019-09-14 NOTE — BH Assessment (Signed)
TTS completed reassessment. Pt presents alert, irritable but cooperative and oriented x 3. Pt continues to admit to active drug use (Meth & Alcohol). Pt denies endorsing any paranoia, hallucinations or SI symptoms. Pt reports to be unable to remember yesterday and stated "I'm only in here because of my grandmother she wants me to stop using drugs".  Pt grew irritable after being informed about IVC and demanded to leave the hospital. Pt denies any current SI/HI/AV/VH and contracted for safety stating, "I'm not a danger to myself or anyone I wouldn't even kill a roach if I saw one".   Collateral contact made with grandmother Aurea Graff [phone]): Aurea Graff confirmed pt to live with her and identified her primary concerns to be pt's continuous drug use, aggressive and bizarre behaviors ( paranoia, VH, AH). Aurea Graff reports pt to be unable to return to her home due to his aggressive behaviors, threats and fear for her safety.   Per Dr. Smith Robert pt meets criteria for INPT

## 2019-09-15 ENCOUNTER — Other Ambulatory Visit: Payer: Self-pay

## 2019-09-15 ENCOUNTER — Inpatient Hospital Stay
Admission: RE | Admit: 2019-09-15 | Discharge: 2019-09-16 | DRG: 897 | Disposition: A | Discharge status: Home / Self Care | Payer: No Typology Code available for payment source | Source: Intra-hospital | Attending: Psychiatry | Admitting: Psychiatry

## 2019-09-15 ENCOUNTER — Encounter: Payer: Self-pay | Admitting: Internal Medicine

## 2019-09-15 DIAGNOSIS — F15920 Other stimulant use, unspecified with intoxication, uncomplicated: Secondary | ICD-10-CM | POA: Diagnosis not present

## 2019-09-15 DIAGNOSIS — F17210 Nicotine dependence, cigarettes, uncomplicated: Secondary | ICD-10-CM | POA: Diagnosis present

## 2019-09-15 DIAGNOSIS — R451 Restlessness and agitation: Secondary | ICD-10-CM | POA: Diagnosis present

## 2019-09-15 DIAGNOSIS — F15929 Other stimulant use, unspecified with intoxication, unspecified: Secondary | ICD-10-CM | POA: Diagnosis present

## 2019-09-15 DIAGNOSIS — F15129 Other stimulant abuse with intoxication, unspecified: Principal | ICD-10-CM | POA: Diagnosis present

## 2019-09-15 DIAGNOSIS — F1510 Other stimulant abuse, uncomplicated: Secondary | ICD-10-CM

## 2019-09-15 DIAGNOSIS — F1010 Alcohol abuse, uncomplicated: Secondary | ICD-10-CM | POA: Diagnosis present

## 2019-09-15 MED ORDER — MAGNESIUM HYDROXIDE 400 MG/5ML PO SUSP: 30.0000 mL | Freq: Every day | ORAL | Status: DC | PRN

## 2019-09-15 MED ORDER — OLANZAPINE 10 MG PO TABS: 10.0000 mg | ORAL_TABLET | Freq: Every day | ORAL | Status: DC

## 2019-09-15 MED ORDER — ACETAMINOPHEN 325 MG PO TABS: 650.0000 mg | ORAL_TABLET | Freq: Four times a day (QID) | ORAL | Status: DC | PRN

## 2019-09-15 MED ORDER — ALUM & MAG HYDROXIDE-SIMETH 200-200-20 MG/5ML PO SUSP: 30.0000 mL | ORAL | Status: DC | PRN

## 2019-09-15 MED ORDER — HYDROXYZINE HCL 10 MG PO TABS: 10.0000 mg | ORAL_TABLET | Freq: Three times a day (TID) | ORAL | Status: DC | PRN

## 2019-09-15 MED ORDER — VENLAFAXINE HCL ER 37.5 MG PO CP24
37.5000 mg | ORAL_CAPSULE | Freq: Every day | ORAL | Status: DC
  Filled 2019-09-15: qty 1

## 2019-09-15 NOTE — ED Notes (Signed)
Hourly rounding reveals patient in room. No complaints, stable, in no acute distress. Q15 minute rounds and monitoring via Security Cameras to continue. 

## 2019-09-15 NOTE — ED Notes (Signed)
Pt denies SI/HI/AVH on assessment 

## 2019-09-15 NOTE — ED Notes (Signed)

## 2019-09-15 NOTE — ED Provider Notes (Signed)
Emergency Medicine Observation Re-evaluation Note  Luis Terrell is a 34 y.o. male, seen today  Physical Exam  BP 99/64 (BP Location: Left Arm)   Pulse 77   Temp 98.3 F (36.8 C) (Oral)   Resp 18   Wt 74.8 kg   SpO2 98%   BMI 21.77 kg/m  Physical Exam  Constitutional: Patient resting quietly in bed Respiratory: Patient in no respiratory distress Psych: Patient is not combative or agitated  ED Course / MDM  EKG:    I have reviewed the labs performed to date Plan  Disposition per psychiatry   Arnaldo Natal, MD 09/15/19 [phone]

## 2019-09-15 NOTE — ED Notes (Signed)
IVC, to be admitted to [facility]

## 2019-09-15 NOTE — Plan of Care (Signed)
Patient new to the unit tonight, hasn't had time to progress  Problem: Education: Goal: Knowledge of [place] [facility] Education information/materials will improve Outcome: Not Progressing Goal: Emotional status will improve Outcome: Not Progressing Goal: Mental status will improve Outcome: Not Progressing Goal: Verbalization of understanding the information provided will improve Outcome: Not Progressing   Problem: Safety: Goal: Periods of time without injury will increase Outcome: Not Progressing   Problem: Education: Goal: Ability to make informed decisions regarding treatment will improve Outcome: Not Progressing   Problem: Coping: Goal: Coping ability will improve Outcome: Not Progressing   

## 2019-09-15 NOTE — BH Assessment (Signed)
Patient is to be admitted to [facility] by Dr. Smith Robert.  Attending Physician will be Dr. Toni Amend.   Patient has been assigned to room 323, by [facility] Charge Nurse Zettie Cooley RN.   Intake Paper Work has been signed and placed on patient chart.  ER staff is aware of the admission:  Nitchia, ER Secretary    Dr. Darnelle Catalan, ER MD   Amy, RN, Patient's Nurse   Vikki Ports, Patient Access.  Pt can tentatively arrive after 4pm.

## 2019-09-16 DIAGNOSIS — F1010 Alcohol abuse, uncomplicated: Secondary | ICD-10-CM

## 2019-09-16 DIAGNOSIS — F15920 Other stimulant use, unspecified with intoxication, uncomplicated: Secondary | ICD-10-CM

## 2019-09-16 DIAGNOSIS — F1510 Other stimulant abuse, uncomplicated: Secondary | ICD-10-CM

## 2019-09-16 LAB — TSH: TSH: 0.015 u[IU]/mL — ABNORMAL LOW (ref 0.350–4.500)

## 2019-09-16 NOTE — Discharge Summary (Signed)
Physician Discharge Summary Note  Patient:  Luis Terrell is an 34 y.o., male MRN:  [id number] DOB:  [birth year] Patient phone:  [phone] (home)  Patient address:   [address] [place] [address],  Total Time spent with patient: 1 hour  Date of Admission:  09/15/2019 Date of Discharge: 09/16/2019  Reason for Admission: Admitted after referral from the emergency room where he presented on commitment papers alleging dangerousness and psychosis when using drugs  Principal Problem: Amphetamine intoxication [facility]) Discharge Diagnoses: Principal Problem:   Amphetamine intoxication (HCC) Active Problems:   Alcohol abuse   Amphetamine abuse (HCC)   Past Psychiatric History: Past history of some substance abuse treatment  Past Medical History:  Past Medical History:  Diagnosis Date  . Leg pain, left    Chronic pain after remote fracture    Past Surgical History:  Procedure Laterality Date  . APPENDECTOMY    . FRACTURE SURGERY    . leg repair  12/17/2009   Family History:  Family History  Problem Relation Age of Onset  . Clotting disorder Sister   . Heart attack Maternal Uncle   . Heart attack Maternal Grandmother   . Liver disease Maternal Grandfather   . Sudden Cardiac Death Neg Hx    Family Psychiatric  History: Reports multiple family members with drug and alcohol problems Social History:  Social History   Substance and Sexual Activity  Alcohol Use No     Social History   Substance and Sexual Activity  Drug Use No    Social History   Socioeconomic History  . Marital status: Divorced    Spouse name: Not on file  . Number of children: Not on file  . Years of education: Not on file  . Highest education level: Not on file  Occupational History  . Not on file  Tobacco Use  . Smoking status: Current Every Day Smoker    Packs/day: 0.75    Years: 9.00    Pack years: 6.75    Types: Cigarettes  . Smokeless tobacco: Never Used  Substance and  Sexual Activity  . Alcohol use: No  . Drug use: No  . Sexual activity: Not on file  Other Topics Concern  . Not on file  Social History Narrative  . Not on file   Social Determinants of Health   Financial Resource Strain:   . Difficulty of Paying Living Expenses: Not on file  Food Insecurity:   . Worried About Programme researcher, broadcasting/film/video in the Last Year: Not on file  . Ran Out of Food in the Last Year: Not on file  Transportation Needs:   . Lack of Transportation (Medical): Not on file  . Lack of Transportation (Non-Medical): Not on file  Physical Activity:   . Days of Exercise per Week: Not on file  . Minutes of Exercise per Session: Not on file  Stress:   . Feeling of Stress : Not on file  Social Connections:   . Frequency of Communication with Friends and Family: Not on file  . Frequency of Social Gatherings with Friends and Family: Not on file  . Attends Religious Services: Not on file  . Active Member of Clubs or Organizations: Not on file  . Attends Banker Meetings: Not on file  . Marital Status: Not on file    Hospital Course: Patient admitted to psychiatric ward.  15-minute checks maintained.  Patient did not display any dangerous aggressive or violent behaviors.  He was  consistent in denying suicidal ideation.  Patient's history does not suggest the need for any current psychiatric medicine but rather for referral to outpatient substance abuse treatment.  He was given psychoeducation about the dangers of ongoing abuse of alcohol and amphetamines including recurrent hospitalizations violent behavior illegal behavior and harm to self.  Patient was accepting of information about referral to follow-up substance abuse treatment at [facility].  Discontinue IVC discontinue any medication and the patient can be discharged today to home follow-up.  Physical Findings: AIMS:  , ,  ,  ,    CIWA:    COWS:     Musculoskeletal: Strength & Muscle Tone: within normal limits Gait &  Station: normal Patient leans: N/A  Psychiatric Specialty Exam: Physical Exam Vitals and nursing note reviewed.  Constitutional:      Appearance: He is well-developed.  HENT:     Head: Normocephalic and atraumatic.  Eyes:     Conjunctiva/sclera: Conjunctivae normal.     Pupils: Pupils are equal, round, and reactive to light.  Cardiovascular:     Heart sounds: Normal heart sounds.  Pulmonary:     Effort: Pulmonary effort is normal.  Abdominal:     Palpations: Abdomen is soft.  Musculoskeletal:        General: Normal range of motion.     Cervical back: Normal range of motion.  Skin:    General: Skin is warm and dry.  Neurological:     General: No focal deficit present.     Mental Status: He is alert.  Psychiatric:        Mood and Affect: Mood normal.        Behavior: Behavior normal.        Thought Content: Thought content normal.        Judgment: Judgment normal.     Review of Systems  Constitutional: Negative.   HENT: Negative.   Eyes: Negative.   Respiratory: Negative.   Cardiovascular: Negative.   Gastrointestinal: Negative.   Musculoskeletal: Negative.   Skin: Negative.   Neurological: Negative.   Psychiatric/Behavioral: Negative.     Blood pressure 135/83, pulse 85, temperature 98.9 F (37.2 C), temperature source Oral, resp. rate 17, height 6\' 1"  (1.854 m), weight 74.8 kg, SpO2 100 %.Body mass index is 21.76 kg/m.  General Appearance: Casual  Eye Contact:  Good  Speech:  Clear and Coherent  Volume:  Normal  Mood:  Euthymic  Affect:  Congruent  Thought Process:  Goal Directed  Orientation:  Full (Time, Place, and Person)  Thought Content:  Logical  Suicidal Thoughts:  No  Homicidal Thoughts:  No  Memory:  Immediate;   Fair Recent;   Fair Remote;   Fair  Judgement:  Fair  Insight:  Fair  Psychomotor Activity:  Normal  Concentration:  Concentration: Fair  Recall:  of Knowledge:  Fair  Language:  Fair  Akathisia:  No  Handed:  Right   AIMS (if indicated):     Assets:  Desire for Improvement  ADL's:  Intact  Cognition:  WNL  Sleep:  Number of Hours: 5.75     Have you used any form of tobacco in the last 30 days? (Cigarettes, Smokeless Tobacco, Cigars, and/or Pipes): Yes  Has this patient used any form of tobacco in the last 30 days? (Cigarettes, Smokeless Tobacco, Cigars, and/or Pipes) Yes, No  Blood Alcohol level:  Lab Results  Component Value Date   ETH <10 09/13/2019   ETH <10 07/10/2019  Metabolic Disorder Labs:  No results found for: HGBA1C, MPG No results found for: PROLACTIN No results found for: CHOL, TRIG, HDL, CHOLHDL, VLDL, LDLCALC  See Psychiatric Specialty Exam and Suicide Risk Assessment completed by Attending Physician prior to discharge.  Discharge destination:  Home  Is patient on multiple antipsychotic therapies at discharge:  No   Has Patient had three or more failed trials of antipsychotic monotherapy by history:  No  Recommended Plan for Multiple Antipsychotic Therapies: NA  Discharge Instructions    Diet - low sodium heart healthy   Complete by: As directed    Increase activity slowly   Complete by: As directed      Allergies as of 09/16/2019   No Known Allergies     Medication List    You have not been prescribed any medications.      Follow-up recommendations:  Activity:  Activity as tolerated Diet:  Regular diet Other:  Encourage follow-up at [facility]  Comments: No prescriptions required at discharge  Signed: Mordecai Rasmussen, MD 09/16/2019, 11:11 AM

## 2019-09-16 NOTE — BHH Suicide Risk Assessment (Signed)
[facility] Admission Suicide Risk Assessment   Nursing information obtained from:  Patient Demographic factors:  Male Current Mental Status:  NA Loss Factors:  NA Historical Factors:  NA Risk Reduction Factors:  NA  Total Time spent with patient: 1 hour Principal Problem: Amphetamine intoxication ([place]) Diagnosis:  Principal Problem:   Amphetamine intoxication ([place]) Active Problems:   Alcohol abuse   Amphetamine abuse ([place])  Subjective Data: Patient admitted from the emergency room.  He had initially presented intoxicated agitated and having reports of having made threatening statements to family.  At this time he is calm and lucid and appropriate and denies any suicidal or homicidal ideation and shows no sign of mood or psychotic symptoms  Continued Clinical Symptoms:  Alcohol Use Disorder Identification Test Final Score (AUDIT): 0 The "Alcohol Use Disorders Identification Test", Guidelines for Use in Primary Care, Second Edition.  World Pharmacologist [facility]). Score between 0-7:  no or low risk or alcohol related problems. Score between 8-15:  moderate risk of alcohol related problems. Score between 16-19:  high risk of alcohol related problems. Score 20 or above:  warrants further diagnostic evaluation for alcohol dependence and treatment.   CLINICAL FACTORS:   Alcohol/Substance Abuse/Dependencies   Musculoskeletal: Strength & Muscle Tone: within normal limits Gait & Station: normal Patient leans: N/A  Psychiatric Specialty Exam: Physical Exam Vitals and nursing note reviewed.  Constitutional:      Appearance: He is well-developed.  HENT:     Head: Normocephalic and atraumatic.  Eyes:     Conjunctiva/sclera: Conjunctivae normal.     Pupils: Pupils are equal, round, and reactive to light.  Cardiovascular:     Heart sounds: Normal heart sounds.  Pulmonary:     Effort: Pulmonary effort is normal.  Abdominal:     Palpations: Abdomen is soft.  Musculoskeletal:         General: Normal range of motion.     Cervical back: Normal range of motion.  Skin:    General: Skin is warm and dry.  Neurological:     General: No focal deficit present.     Mental Status: He is alert.  Psychiatric:        Attention and Perception: Attention normal.        Mood and Affect: Mood normal.        Speech: Speech normal.        Behavior: Behavior normal.        Thought Content: Thought content normal.        Cognition and Memory: Cognition normal.        Judgment: Judgment normal.     Review of Systems  Constitutional: Negative.   HENT: Negative.   Eyes: Negative.   Respiratory: Negative.   Cardiovascular: Negative.   Gastrointestinal: Negative.   Musculoskeletal: Negative.   Skin: Negative.   Neurological: Negative.   Psychiatric/Behavioral: Negative.     Blood pressure 135/83, pulse 85, temperature 98.9 F (37.2 C), temperature source Oral, resp. rate 17, height 6' 1"  (1.854 m), weight 74.8 kg, SpO2 100 %.Body mass index is 21.76 kg/m.  General Appearance: Casual  Eye Contact:  Fair  Speech:  Clear and Coherent  Volume:  Normal  Mood:  Euthymic  Affect:  Congruent  Thought Process:  Coherent  Orientation:  Full (Time, Place, and Person)  Thought Content:  Logical  Suicidal Thoughts:  No  Homicidal Thoughts:  No  Memory:  Immediate;   Fair Recent;   Fair Remote;   Fair  Judgement:  Fair  Insight:  Fair  Psychomotor Activity:  Normal  Concentration:  Concentration: Fair  Recall:  [employer] of Knowledge:  Fair  Language:  Fair  Akathisia:  No  Handed:  Right  AIMS (if indicated):     Assets:  Desire for Improvement  ADL's:  Intact  Cognition:  WNL  Sleep:  Number of Hours: 5.75      COGNITIVE FEATURES THAT CONTRIBUTE TO RISK:  None    SUICIDE RISK:   Minimal: No identifiable suicidal ideation.  Patients presenting with no risk factors but with morbid ruminations; may be classified as minimal risk based on the severity of the depressive  symptoms  PLAN OF CARE: Patient has problems with amphetamines and alcohol which he acknowledges.  Currently no sign of withdrawal no sign of psychosis no acutely treatable issue in the psychiatric unit.  He has met with treatment team and will be referred to outpatient care at discharge  I certify that inpatient services furnished can reasonably be expected to improve the patient's condition.   Alethia Berthold, MD 09/16/2019, 11:02 AM

## 2019-09-16 NOTE — [facility] Suicide Risk Assessment (Signed)
[facility] Discharge Suicide Risk Assessment   Principal Problem: Amphetamine intoxication (HCC) Discharge Diagnoses: Principal Problem:   Amphetamine intoxication (HCC) Active Problems:   Alcohol abuse   Amphetamine abuse (HCC)   Total Time spent with patient: 1 hour  Musculoskeletal: Strength & Muscle Tone: within normal limits Gait & Station: normal Patient leans: N/A  Psychiatric Specialty Exam: Review of Systems  Constitutional: Negative.   HENT: Negative.   Eyes: Negative.   Respiratory: Negative.   Cardiovascular: Negative.   Gastrointestinal: Negative.   Musculoskeletal: Negative.   Skin: Negative.   Neurological: Negative.   Psychiatric/Behavioral: Negative.     Blood pressure 135/83, pulse 85, temperature 98.9 F (37.2 C), temperature source Oral, resp. rate 17, height 6\' 1"  (1.854 m), weight 74.8 kg, SpO2 100 %.Body mass index is 21.76 kg/m.  General Appearance: Casual  Eye Contact::  Good  Speech:  Clear and Coherent409  Volume:  Normal  Mood:  Euthymic  Affect:  Appropriate  Thought Process:  Coherent  Orientation:  Full (Time, Place, and Person)  Thought Content:  Logical  Suicidal Thoughts:  No  Homicidal Thoughts:  No  Memory:  Immediate;   Fair Recent;   Fair Remote;   Fair  Judgement:  Fair  Insight:  Fair  Psychomotor Activity:  Normal  Concentration:  Fair  Recall:  [phone] of Knowledge:Fair  Language: Fair  Akathisia:  No  Handed:  Right  AIMS (if indicated):     Assets:  Desire for Improvement Housing Physical Health Resilience  Sleep:  Number of Hours: 5.75  Cognition: WNL  ADL's:  Intact   Mental Status Per Nursing Assessment::   On Admission:  NA  Demographic Factors:  Male, Low socioeconomic status and Unemployed  Loss Factors: NA  Historical Factors: Impulsivity  Risk Reduction Factors:   Living with another person, especially a relative  Continued Clinical Symptoms:  Alcohol/Substance  Abuse/Dependencies  Cognitive Features That Contribute To Risk:  None    Suicide Risk:  Minimal: No identifiable suicidal ideation.  Patients presenting with no risk factors but with morbid ruminations; may be classified as minimal risk based on the severity of the depressive symptoms    Plan Of Care/Follow-up recommendations:  Activity:  Activity as tolerated Diet:  Regular diet Other:  Follow-up with outpatient treatment  [phone], MD 09/16/2019, 11:09 AM

## 2019-09-16 NOTE — BHH Counselor (Signed)
Patient declined aftercare referrals at this time.   Penni Homans, MSW, LCSW 09/16/2019 11:13 AM

## 2019-09-16 NOTE — BHH Suicide Risk Assessment (Signed)
[facility] INPATIENT:  Family/Significant Other Suicide Prevention Education  Suicide Prevention Education:  Patient Refusal for Family/Significant Other Suicide Prevention Education: The patient Luis Terrell has refused to provide written consent for family/significant other to be provided Family/Significant Other Suicide Prevention Education during admission and/or prior to discharge.  Physician notified.  SPE completed with pt, as pt refused to consent to family contact. SPI pamphlet provided to pt and pt was encouraged to share information with support network, ask questions, and talk about any concerns relating to SPE. Pt denies access to guns/firearms and verbalized understanding of information provided. Mobile Crisis information also provided to pt.   Harden Mo 09/16/2019, 11:16 AM

## 2019-09-16 NOTE — H&P (Signed)
Psychiatric Admission Assessment Adult  Patient Identification: Luis Terrell MRN:  [id number] Date of Evaluation:  09/16/2019 Chief Complaint:  Amphetamine intoxication [facility]) [F15.929] Principal Diagnosis: Amphetamine intoxication ([place]) Diagnosis:  Principal Problem:   Amphetamine intoxication ([place]) Active Problems:   Alcohol abuse   Amphetamine abuse ([place])  History of Present Illness: Patient seen chart reviewed.  34 year old man brought to the emergency room under IVC filed by family reporting threatening agitated psychotic behavior at home.  Patient admits to use of amphetamines and alcohol daily.  Admits that when he is intoxicated he will get confused and agitated.  He does not have any memory of making threatening statements to himself or to his family and denies any suicidal or homicidal ideation.  Denies any ongoing mood symptoms.  Denies any current medical symptoms.  Patient is calm and appropriate and interactive in a reasonable way.  Agrees that stopping drug use would be a good idea.  Met with treatment team and representative from [place].  At this point asymptomatic and no longer needing hospital level care Associated Signs/Symptoms: Depression Symptoms:  None reported (Hypo) Manic Symptoms:  None currently Anxiety Symptoms:  Denies Psychotic Symptoms:  Denies PTSD Symptoms: Negative Total Time spent with patient: 1 hour  Past Psychiatric History: No past inpatient treatment.  Has had past outpatient treatment referrals.  No history of suicide attempts.  Is the patient at risk to self? No.  Has the patient been a risk to self in the past 6 months? No.  Has the patient been a risk to self within the distant past? No.  Is the patient a risk to others? No.  Has the patient been a risk to others in the past 6 months? Yes.    Has the patient been a risk to others within the distant past? No.   Prior Inpatient Therapy:   Prior Outpatient Therapy:    Alcohol Screening: 1.  How often do you have a drink containing alcohol?: Never 2. How many drinks containing alcohol do you have on a typical day when you are drinking?: 1 or 2 3. How often do you have six or more drinks on one occasion?: Never AUDIT-C Score: 0 4. How often during the last year have you found that you were not able to stop drinking once you had started?: Never 5. How often during the last year have you failed to do what was normally expected from you because of drinking?: Never 6. How often during the last year have you needed a first drink in the morning to get yourself going after a heavy drinking session?: Never 7. How often during the last year have you had a feeling of guilt of remorse after drinking?: Never 8. How often during the last year have you been unable to remember what happened the night before because you had been drinking?: Never 9. Have you or someone else been injured as a result of your drinking?: No 10. Has a relative or friend or a doctor or another health worker been concerned about your drinking or suggested you cut down?: No Alcohol Use Disorder Identification Test Final Score (AUDIT): 0 Alcohol Brief Interventions/Follow-up: AUDIT Score <7 follow-up not indicated Substance Abuse History in the last 12 months:  Yes.   Consequences of Substance Abuse: Medical Consequences:  Hospitalizations.  Possible psychotic symptoms.  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  Leg pain, left    Chronic pain after remote fracture    Past Surgical History:  Procedure Laterality Date  . APPENDECTOMY    . FRACTURE SURGERY    . leg repair  12/17/2009   Family History:  Family History  Problem Relation Age of Onset  . Clotting disorder Sister   . Heart attack Maternal Uncle   . Heart attack Maternal Grandmother   . Liver disease Maternal  Grandfather   . Sudden Cardiac Death Neg Hx    Family Psychiatric  History: Reports multiple members of his family with drug and alcohol problems Tobacco Screening: Have you used any form of tobacco in the last 30 days? (Cigarettes, Smokeless Tobacco, Cigars, and/or Pipes): Yes Tobacco use, Select all that apply: 5 or more cigarettes per day Are you interested in Tobacco Cessation Medications?: No, patient refused Counseled patient on smoking cessation including recognizing danger situations, developing coping skills and basic information about quitting provided: Refused/Declined practical counseling Social History:  Social History   Substance and Sexual Activity  Alcohol Use No     Social History   Substance and Sexual Activity  Drug Use No    Additional Social History:                           Allergies:  No Known Allergies Lab Results:  Results for orders placed or performed during the hospital encounter of 09/15/19 (from the past 48 hour(s))  TSH     Status: Abnormal   Collection Time: 09/16/19  7:31 AM  Result Value Ref Range   TSH 0.015 (L) 0.350 - 4.500 uIU/mL    Comment: Performed by a 3rd Generation assay with a functional sensitivity of <=0.01 uIU/mL. Performed at [facility], [address] [place], [place] [address]     Blood Alcohol level:  Lab Results  Component Value Date   [facility] <10 09/13/2019   ETH <10 19/37/9024    Metabolic Disorder Labs:  No results found for: HGBA1C, MPG No results found for: PROLACTIN No results found for: CHOL, TRIG, HDL, CHOLHDL, VLDL, LDLCALC  Current Medications: Current Facility-Administered Medications  Medication Dose Route Frequency Provider Last Rate Last Admin  . acetaminophen (TYLENOL) tablet 650 mg  650 mg Oral Q6H PRN Eulas Post, MD      . alum & mag hydroxide-simeth (MAALOX/MYLANTA) 200-200-20 MG/5ML suspension 30 mL  30 mL Oral Q4H PRN Eulas Post, MD      . hydrOXYzine  (ATARAX/VISTARIL) tablet 10 mg  10 mg Oral TID PRN Eulas Post, MD      . magnesium hydroxide (MILK OF MAGNESIA) suspension 30 mL  30 mL Oral Daily PRN Eulas Post, MD      . OLANZapine [facility]) tablet 10 mg  10 mg Oral QHS Eulas Post, MD      . venlafaxine XR (EFFEXOR-XR) 24 hr capsule 37.5 mg  37.5 mg Oral Q breakfast Eulas Post, MD       PTA Medications: No medications prior to admission.    Musculoskeletal: Strength & Muscle Tone: within normal limits Gait & Station: normal Patient leans: N/A  Psychiatric Specialty Exam: Physical Exam Vitals and nursing note reviewed.  Constitutional:      Appearance: He is well-developed.  HENT:     Head: Normocephalic and atraumatic.  Eyes:     Conjunctiva/sclera: Conjunctivae normal.     Pupils: Pupils are equal, round, and reactive to light.  Cardiovascular:  Heart sounds: Normal heart sounds.  Pulmonary:     Effort: Pulmonary effort is normal.  Abdominal:     Palpations: Abdomen is soft.  Musculoskeletal:        General: Normal range of motion.     Cervical back: Normal range of motion.  Skin:    General: Skin is warm and dry.  Neurological:     General: No focal deficit present.     Mental Status: He is alert.  Psychiatric:        Mood and Affect: Mood normal.        Behavior: Behavior normal.        Thought Content: Thought content normal.        Judgment: Judgment normal.     Review of Systems  Constitutional: Negative.   HENT: Negative.   Eyes: Negative.   Respiratory: Negative.   Cardiovascular: Negative.   Gastrointestinal: Negative.   Musculoskeletal: Negative.   Skin: Negative.   Neurological: Negative.   Psychiatric/Behavioral: Negative.     Blood pressure 135/83, pulse 85, temperature 98.9 F (37.2 C), temperature source Oral, resp. rate 17, height 6' 1"  (1.854 m), weight 74.8 kg, SpO2 100 %.Body mass index is 21.76 kg/m.  General Appearance: Casual  Eye Contact:  Good  Speech:   Clear and Coherent  Volume:  Normal  Mood:  Euthymic  Affect:  Congruent  Thought Process:  Goal Directed  Orientation:  Full (Time, Place, and Person)  Thought Content:  Logical  Suicidal Thoughts:  No  Homicidal Thoughts:  No  Memory:  Immediate;   Fair Recent;   Fair Remote;   Fair  Judgement:  Fair  Insight:  Fair  Psychomotor Activity:  Normal  Concentration:  Concentration: Fair  Recall:  [employer] of Knowledge:  Fair  Language:  Fair  Akathisia:  No  Handed:  Right  AIMS (if indicated):     Assets:  Desire for Improvement  ADL's:  Intact  Cognition:  WNL  Sleep:  Number of Hours: 5.75    Treatment Plan Summary: Plan Patient is currently not expressing any symptoms and does not require further inpatient treatment.  Psychoeducation will be done about dangers of substance abuse and he will be given referrals for outpatient treatment prior to discharge  Observation Level/Precautions:  15 minute checks  Laboratory:  UDS  Psychotherapy:    Medications:    Consultations:    Discharge Concerns:    Estimated LOS:  Other:     Physician Treatment Plan for Primary Diagnosis: Amphetamine intoxication ([place]) Long Term Goal(s): Improvement in symptoms so as ready for discharge  Short Term Goals: Ability to demonstrate self-control will improve  Physician Treatment Plan for Secondary Diagnosis: Principal Problem:   Amphetamine intoxication ([place]) Active Problems:   Alcohol abuse   Amphetamine abuse ([place])  Long Term Goal(s): Improvement in symptoms so as ready for discharge  Short Term Goals: Ability to maintain clinical measurements within normal limits will improve  I certify that inpatient services furnished can reasonably be expected to improve the patient's condition.    Alethia Berthold, MD 8/19/202111:04 AM

## 2019-09-16 NOTE — BHH Counselor (Signed)
Adult Comprehensive Assessment  Patient ID: RIZWAN KUYPER, male   DOB: Aug 09, 1985, 34 y.o.   MRN: [id number]  Information Source:    Current Stressors:     Living/Environment/Situation:  Living Arrangements: Other relatives  Family History:     Childhood History:     Education:     Employment/Work Situation:      Surveyor, quantity Resources:      Alcohol/Substance Abuse:      Social Support System:      Leisure/Recreation:      Strengths/Needs:      Discharge Plan:      Summary/Recommendations:   Emergency planning/management officer and Recommendations (to be completed by the evaluator): Patient is a 34 year old male [facility] [place] [place] [facility]   He presents to the hospital following concerns for substance use and hallucinations and stating plans to harm himself.  Recommendations include: crisis stabilization, therapeutic milieu, encourage group attendance and participation, medication management for detox/mood stabilization and development of comprehensive mental wellness/sobriety plan.  Harden Mo. 09/16/2019

## 2019-09-16 NOTE — Plan of Care (Signed)
Pt denies depression, anxiety, SI, HI and AVH, Pt was educated on care plan and verbalizes understanding. Torrie Mayers RN Problem: Education: Goal: Charity fundraiser Education information/materials will improve Outcome: Progressing Goal: Emotional status will improve Outcome: Progressing Goal: Mental status will improve Outcome: Progressing Goal: Verbalization of understanding the information provided will improve Outcome: Progressing   Problem: Safety: Goal: Periods of time without injury will increase Outcome: Progressing   Problem: Education: Goal: Ability to make informed decisions regarding treatment will improve Outcome: Progressing   Problem: Coping: Goal: Coping ability will improve Outcome: Progressing

## 2019-09-16 NOTE — Progress Notes (Signed)
Recreation Therapy Notes  Date: 09/16/2019  Time: 9:30 am   Location: Room 21   Behavioral response: N/A   Intervention Topic: Animal Assisted therapy    Discussion/Intervention: Patient did not attend group.   Clinical Observations/Feedback:  Patient did not attend group.   Xareni Kelch LRT/CTRS        Beau Ramsburg 09/16/2019 11:38 AM

## 2019-09-16 NOTE — Tx Team (Signed)
Initial Treatment Plan 09/16/2019 4:01 AM Luis Terrell XLK:[id number]    PATIENT STRESSORS: Financial difficulties Legal issue Medication change or noncompliance Occupational concerns Substance abuse   PATIENT STRENGTHS: Capable of independent living Supportive family/friends   PATIENT IDENTIFIED PROBLEMS: ETOH and SA abuse  Occupational concerns  Legal concens  Family dynamics               DISCHARGE CRITERIA:  Improved stabilization in mood, thinking, and/or behavior Motivation to continue treatment in a less acute level of care Verbal commitment to aftercare and medication compliance Withdrawal symptoms are absent or subacute and managed without 24-hour nursing intervention  PRELIMINARY DISCHARGE PLAN: Attend 12-step recovery group Outpatient therapy Return to previous living arrangement  PATIENT/FAMILY INVOLVEMENT: This treatment plan has been presented to and reviewed with the patient, Luis Terrell.  The patient has been given the opportunity to ask questions and make suggestions.  Luis Meath L Butler-Nicholson, LPN 3/66/4403, 4:74 AM
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
## 2019-09-16 NOTE — Progress Notes (Signed)
°  [facility] Adult Case Management Discharge Plan :  Will you be returning to the same living situation after discharge:  No.  Patient reports that he is going to his sisters home.  At discharge, do you have transportation home?: Yes,  pt reports his sister will provide transportation. Do you have the ability to pay for your medications: No.  Release of information consent forms completed and in the chart;  Patient's signature needed at discharge.  Patient to Follow up at:  Follow-up Information    Patient declined Follow up.   Why: Patient declined stating that he has Lorella Nimrod, Peer Support with [facility] contact infomration if he would like to begin outpatient treatment.  Contact information: Patient declined.              Next level of care provider has access to [facility] Link:no  Safety Planning and Suicide Prevention discussed: No.  SPE completed with pt.  Pt declined collateral contact.   Have you used any form of tobacco in the last 30 days? (Cigarettes, Smokeless Tobacco, Cigars, and/or Pipes): Yes  Has patient been referred to the Quitline?: Patient refused referral  Patient has been referred for addiction treatment: Pt. refused referral  Harden Mo, LCSW 09/16/2019, 11:14 AM

## 2019-09-16 NOTE — Progress Notes (Signed)
Pt denies SI, HI and AVH. Pt was educated on dc plan and verbalizes understanding. Pt received dc packet and belongings. Cristan Hout RN 

## 2019-09-16 NOTE — Tx Team (Signed)
Interdisciplinary Treatment and Diagnostic Plan Update  09/16/2019 Time of Session: 9:00AM Luis Terrell MRN: [id number]  Principal Diagnosis: Amphetamine intoxication [facility])  Secondary Diagnoses: Principal Problem:   Amphetamine intoxication ([facility]) Active Problems:   Alcohol abuse   Amphetamine abuse ([facility])   Current Medications:  Current Facility-Administered Medications  Medication Dose Route Frequency Provider Last Rate Last Admin   acetaminophen (TYLENOL) tablet 650 mg  650 mg Oral Q6H PRN Roselind Messier, MD       alum & mag hydroxide-simeth (MAALOX/MYLANTA) 200-200-20 MG/5ML suspension 30 mL  30 mL Oral Q4H PRN Roselind Messier, MD       hydrOXYzine (ATARAX/VISTARIL) tablet 10 mg  10 mg Oral TID PRN Roselind Messier, MD       magnesium hydroxide (MILK OF MAGNESIA) suspension 30 mL  30 mL Oral Daily PRN Roselind Messier, MD       OLANZapine (ZYPREXA) tablet 10 mg  10 mg Oral QHS Roselind Messier, MD       venlafaxine XR (EFFEXOR-XR) 24 hr capsule 37.5 mg  37.5 mg Oral Q breakfast Roselind Messier, MD       PTA Medications: No medications prior to admission.    Patient Stressors: Paediatric nurse issue Medication change or noncompliance Occupational concerns Substance abuse  Patient Strengths: Capable of independent living Supportive family/friends  Treatment Modalities: Medication Management, Group therapy, Case management,  1 to 1 session with clinician, Psychoeducation, Recreational therapy.   Physician Treatment Plan for Primary Diagnosis: Amphetamine intoxication ([facility]) Long Term Goal(s): Improvement in symptoms so as ready for discharge Improvement in symptoms so as ready for discharge   Short Term Goals: Ability to demonstrate self-control will improve Ability to maintain clinical measurements within normal limits will improve  Medication Management: Evaluate patient's response, side effects, and tolerance of medication  regimen.  Therapeutic Interventions: 1 to 1 sessions, Unit Group sessions and Medication administration.  Evaluation of Outcomes: Adequate for Discharge  Physician Treatment Plan for Secondary Diagnosis: Principal Problem:   Amphetamine intoxication ([facility]) Active Problems:   Alcohol abuse   Amphetamine abuse ([facility])  Long Term Goal(s): Improvement in symptoms so as ready for discharge Improvement in symptoms so as ready for discharge   Short Term Goals: Ability to demonstrate self-control will improve Ability to maintain clinical measurements within normal limits will improve     Medication Management: Evaluate patient's response, side effects, and tolerance of medication regimen.  Therapeutic Interventions: 1 to 1 sessions, Unit Group sessions and Medication administration.  Evaluation of Outcomes: Adequate for Discharge   RN Treatment Plan for Primary Diagnosis: Amphetamine intoxication ([facility]) Long Term Goal(s): Knowledge of disease and therapeutic regimen to maintain health will improve  Short Term Goals: Ability to demonstrate self-control, Ability to participate in decision making will improve, Ability to verbalize feelings will improve, Ability to identify and develop effective coping behaviors will improve and Compliance with prescribed medications will improve  Medication Management: RN will administer medications as ordered by provider, will assess and evaluate patient's response and provide education to patient for prescribed medication. RN will report any adverse and/or side effects to prescribing provider.  Therapeutic Interventions: 1 on 1 counseling sessions, Psychoeducation, Medication administration, Evaluate responses to treatment, Monitor vital signs and CBGs as ordered, Perform/monitor CIWA, COWS, AIMS and Fall Risk screenings as ordered, Perform wound care treatments as ordered.  Evaluation of Outcomes: Adequate for Discharge   LCSW Treatment Plan for Primary  Diagnosis: Amphetamine intoxication ([facility]) Long Term Goal(s): Safe transition to appropriate next level of  care at discharge, Engage patient in therapeutic group addressing interpersonal concerns.  Short Term Goals: Engage patient in aftercare planning with referrals and resources, Increase social support, Increase emotional regulation, Facilitate acceptance of mental health diagnosis and concerns, Facilitate patient progression through stages of change regarding substance use diagnoses and concerns, Identify triggers associated with mental health/substance abuse issues and Increase skills for wellness and recovery  Therapeutic Interventions: Assess for all discharge needs, 1 to 1 time with Social worker, Explore available resources and support systems, Assess for adequacy in community support network, Educate family and significant other(s) on suicide prevention, Complete Psychosocial Assessment, Interpersonal group therapy.  Evaluation of Outcomes: Adequate for Discharge   Progress in Treatment: Attending groups: Yes. Participating in groups: Yes. Taking medication as prescribed: Yes. Toleration medication: Yes. Family/Significant other contact made: Yes, individual(s) contacted:  once permission is given.  SPE completed with patient. Patient understands diagnosis: Yes. Discussing patient identified problems/goals with staff: Yes. Medical problems stabilized or resolved: Yes. Denies suicidal/homicidal ideation: Yes. Issues/concerns per patient self-inventory: No. Other: none  New problem(s) identified: No, Describe:  none  New Short Term/Long Term Goal(s):  medication management for mood stabilization; elimination of SI thoughts; development of comprehensive mental wellness/sobriety plan.  Patient Goals:  "get out of here"  Discharge Plan or Barriers: Patient reports plans to go to his sisters home.  He reports that he will follow up with [facility] on his own.   Reason for Continuation of  Hospitalization: Aggression Anxiety Depression Medication stabilization Withdrawal symptoms  Estimated Length of Stay:  1-7 days  Attendees: Patient:  Luis Terrell 09/16/2019 11:16 AM  Physician: Dr. Toni Amend, MD 09/16/2019 11:16 AM  Nursing: Torrie Mayers, RN 09/16/2019 11:16 AM  RN Care Manager: 09/16/2019 11:16 AM  Social Worker: Penni Homans, MSW, LCSW 09/16/2019 11:16 AM  Recreational Therapist: Garret Reddish, Drue Flirt, LRT 09/16/2019 11:16 AM  Other: Lowella Dandy, LCSW 09/16/2019 11:16 AM  Other:  09/16/2019 11:16 AM  Other: 09/16/2019 11:16 AM    Scribe for Treatment Team: Harden Mo, LCSW 09/16/2019 11:16 AM

## 2020-04-24 ENCOUNTER — Other Ambulatory Visit: Payer: Self-pay

## 2020-04-24 ENCOUNTER — Encounter: Payer: Self-pay | Admitting: Emergency Medicine

## 2020-04-24 ENCOUNTER — Emergency Department
Admission: EM | Admit: 2020-04-24 | Discharge: 2020-04-24 | Disposition: A | Discharge status: Home / Self Care | Payer: Self-pay | Attending: Emergency Medicine | Admitting: Emergency Medicine

## 2020-04-24 ENCOUNTER — Emergency Department: Payer: Self-pay

## 2020-04-24 DIAGNOSIS — S62111A Displaced fracture of triquetrum [cuneiform] bone, right wrist, initial encounter for closed fracture: Secondary | ICD-10-CM | POA: Insufficient documentation

## 2020-04-24 DIAGNOSIS — W228XXA Striking against or struck by other objects, initial encounter: Secondary | ICD-10-CM | POA: Insufficient documentation

## 2020-04-24 DIAGNOSIS — F17210 Nicotine dependence, cigarettes, uncomplicated: Secondary | ICD-10-CM | POA: Insufficient documentation

## 2020-04-24 MED ORDER — NAPROXEN 500 MG PO TABS: 500.0000 mg | ORAL_TABLET | Freq: Two times a day (BID) | ORAL | 0 refills | Status: AC
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
## 2020-04-24 NOTE — Discharge Instructions (Signed)
Call make an appointment with Dr. Signa Kell who is on-call for orthopedics and is located in the [facility].  Ice and elevation as needed for pain and swelling.  Take naproxen 500 mg twice daily with food as needed for pain and inflammation.  You may also take Tylenol with this medication if additional pain medication is needed.
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
## 2020-05-28 DEATH — deceased
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
